# Patient Record
Sex: Male | Born: 1970
Health system: Southern US, Community
[De-identification: ages and names within clinical notes are randomized; demographics above are authoritative.]

## PROBLEM LIST (undated history)

## (undated) DIAGNOSIS — I639 Cerebral infarction, unspecified: Secondary | ICD-10-CM

## (undated) DIAGNOSIS — A609 Anogenital herpesviral infection, unspecified: Secondary | ICD-10-CM

## (undated) DIAGNOSIS — K219 Gastro-esophageal reflux disease without esophagitis: Secondary | ICD-10-CM

## (undated) DIAGNOSIS — Z9109 Other allergy status, other than to drugs and biological substances: Secondary | ICD-10-CM

## (undated) DIAGNOSIS — G4733 Obstructive sleep apnea (adult) (pediatric): Secondary | ICD-10-CM

## (undated) DIAGNOSIS — N200 Calculus of kidney: Secondary | ICD-10-CM

## (undated) DIAGNOSIS — I10 Essential (primary) hypertension: Secondary | ICD-10-CM

## (undated) HISTORY — DX: Obstructive sleep apnea (adult) (pediatric): G47.33

## (undated) HISTORY — PX: ROTATOR CUFF REPAIR: SHX139

## (undated) HISTORY — DX: Other allergy status, other than to drugs and biological substances: Z91.09

## (undated) HISTORY — DX: Essential (primary) hypertension: I10

## (undated) HISTORY — DX: Calculus of kidney: N20.0

## (undated) HISTORY — PX: LUMBAR DISC SURGERY: SHX700

## (undated) HISTORY — DX: Anogenital herpesviral infection, unspecified: A60.9

## (undated) HISTORY — PX: APPENDECTOMY: SHX54

---

## 1998-02-20 ENCOUNTER — Emergency Department (HOSPITAL_COMMUNITY): Admission: EM | Admit: 1998-02-20 | Discharge: 1998-02-20 | Payer: Self-pay | Admitting: Emergency Medicine

## 1999-07-14 ENCOUNTER — Emergency Department (HOSPITAL_COMMUNITY): Admission: EM | Admit: 1999-07-14 | Discharge: 1999-07-14 | Payer: Self-pay | Admitting: Emergency Medicine

## 1999-07-14 ENCOUNTER — Encounter: Payer: Self-pay | Admitting: Emergency Medicine

## 2002-09-26 ENCOUNTER — Emergency Department (HOSPITAL_COMMUNITY): Admission: EM | Admit: 2002-09-26 | Discharge: 2002-09-27 | Payer: Self-pay | Admitting: Emergency Medicine

## 2002-09-26 ENCOUNTER — Encounter: Payer: Self-pay | Admitting: Emergency Medicine

## 2010-11-22 ENCOUNTER — Encounter (HOSPITAL_COMMUNITY)
Admission: RE | Admit: 2010-11-22 | Discharge: 2010-11-22 | Disposition: A | Discharge status: Home / Self Care | Payer: Managed Care, Other (non HMO) | Source: Ambulatory Visit | Attending: Neurological Surgery | Admitting: Neurological Surgery

## 2010-11-22 LAB — CBC
HCT: 41 % (ref 39.0–52.0)
Hemoglobin: 14.5 g/dL (ref 13.0–17.0)
MCH: 31.3 pg (ref 26.0–34.0)
MCHC: 35.4 g/dL (ref 30.0–36.0)
MCV: 88.6 fL (ref 78.0–100.0)
Platelets: 184 10*3/uL (ref 150–400)
RBC: 4.63 MIL/uL (ref 4.22–5.81)
RDW: 12.6 % (ref 11.5–15.5)
WBC: 7.6 10*3/uL (ref 4.0–10.5)

## 2010-11-22 LAB — SURGICAL PCR SCREEN
MRSA, PCR: NEGATIVE
Staphylococcus aureus: POSITIVE — AB

## 2010-11-30 ENCOUNTER — Ambulatory Visit (HOSPITAL_COMMUNITY): Payer: Managed Care, Other (non HMO)

## 2010-11-30 ENCOUNTER — Ambulatory Visit (HOSPITAL_COMMUNITY)
Admission: RE | Admit: 2010-11-30 | Discharge: 2010-11-30 | Disposition: A | Discharge status: Home / Self Care | Payer: Managed Care, Other (non HMO) | Source: Ambulatory Visit | Attending: Neurological Surgery | Admitting: Neurological Surgery

## 2010-11-30 DIAGNOSIS — Z01812 Encounter for preprocedural laboratory examination: Secondary | ICD-10-CM | POA: Insufficient documentation

## 2010-11-30 DIAGNOSIS — M5020 Other cervical disc displacement, unspecified cervical region: Secondary | ICD-10-CM | POA: Insufficient documentation

## 2010-12-20 NOTE — Op Note (Signed)
Kyle Manning, Kyle Manning NO.:  [id number]  MEDICAL RECORD NO.:  [id number]  LOCATION:  3533                         FACILITY:  [facility]  PHYSICIAN:  Stefani Dama, M.D.  DATE OF BIRTH:  1970-04-20  DATE OF PROCEDURE:  11/30/2010 DATE OF DISCHARGE:  11/30/2010                              OPERATIVE REPORT   PREOPERATIVE DIAGNOSIS:  Herniated nucleus pulposus at C5-C6 of left with left cervical radiculopathy.  POSTOPERATIVE DIAGNOSIS:  Herniated nucleus pulposus at C5-C6 of left with left cervical radiculopathy.  PROCEDURE:  Microdiskectomy at C5-C6, left with operating microscope and microdissection technique in seated position, using Metrax operating system.  SURGEON:  Stefani Dama, MD  FIRST ASSISTANT:  Hilda Lias, MD  ANESTHESIA:  General endotracheal.  INDICATIONS:  Kyle Manning is a 40 year old individual who has had significant neck, shoulder, and left arm pain that came upon rather acutely about 2 months ago.  Kyle Manning has failed to get better and Kyle Manning feels that his strength is not as good as it had been on the left side, although Kyle Manning is a mesomorphically built individual and on confrontational testing his strength seems fine.  The patient notes that his left arm feels considerably weaker than it has been particularly when Kyle Manning is doing some body building exercises. Kyle Manning has been advised regarding the need for surgical decompression.  PROCEDURE:  The patient was brought to the operating room, supine on the stretcher.  After smooth induction of general endotracheal anesthesia, Kyle Manning had placement of central venous catheterization monitoring catheter then Kyle Manning was placed in a 3 pin headrest and gradually placed into the seated position.  Echo Doppler cardiography was obtained to check for air embolism, and the back of the neck was then prepped with alcohol and DuraPrep and draped in a sterile fashion.  Fluoroscopic guidance was used to localize the C5-C6  level on the left side of the needle.  A small vertical incision was then created over this area and a K-wire was passed down to the interlaminar space at C5-C6 on the left side.  Then by dissecting over this a series of dilators were placed to allow dilation to the 18 mm diameter and 18 mm x 6 cm deep endoscopic cannula was then fitted to the operating table with a clamp.  The operating microscope was brought into the view over this aperture and then through this opening, we did the surgery where we then removed the inferior margin lamina of C6-C5 and the mesial wall of the facet.  The superior margin of lamina and facet of C6 was also removed.  The thickened ligament over this area was then opened with a 2 mm Kerrison punch. Epidural bleeding was encountered and this was cauterized with bipolar cautery and some small pledgets of Gelfoam were also used to maintain hemostasis, then the shoulder and the nerve root was identified.  This was noted to be bulged dorsally and in the axilla of the nerve roots we were able to locate a substantial mass through some epidural veins. These veins were cauterized and divided.  Then further dissection yielded the first large fragment of disk which was removed and then several other smaller fragments of disk were identified in this  free space.  Ultimately, after removing numerous fragments I was able to palpate underneath the nerve root the common dural tube and found that this was greatly relaxed.  No other fragments could be retrieved using a ball dissector from both the shoulder of the nerve roots and from the axilla dissecting medially with this hemostasis was obtained.  Dr. Jeral Fruit and I inspected the area carefully.  No other fragments were felt to be in this region and at this point, we removed the operating microscope and removed the endoscopic cannula and then closed the fascia with 3-0 Vicryl interrupted fashion, 3-0 Vicryl was used to close  the subcuticular skin.  The patient tolerated the procedure well and was returned to recovery room in stable condition.  ESTIMATED BLOOD LOSS:  50 mL.     Stefani Dama, M.D.     Merla Riches  D:  11/30/2010  T:  11/30/2010  Job:  [id number]  Electronically Signed by Barnett Abu M.D. on 12/20/2010 07:08:43 AM

## 2011-03-28 ENCOUNTER — Other Ambulatory Visit (HOSPITAL_COMMUNITY): Payer: Managed Care, Other (non HMO)

## 2013-06-27 ENCOUNTER — Encounter: Payer: Self-pay | Admitting: *Deleted

## 2013-06-27 DIAGNOSIS — I10 Essential (primary) hypertension: Secondary | ICD-10-CM | POA: Insufficient documentation

## 2014-12-16 ENCOUNTER — Encounter: Payer: Self-pay | Admitting: Podiatry

## 2014-12-16 ENCOUNTER — Ambulatory Visit (INDEPENDENT_AMBULATORY_CARE_PROVIDER_SITE_OTHER): Payer: BLUE CROSS/BLUE SHIELD | Admitting: Podiatry

## 2014-12-16 ENCOUNTER — Ambulatory Visit (INDEPENDENT_AMBULATORY_CARE_PROVIDER_SITE_OTHER): Payer: BLUE CROSS/BLUE SHIELD

## 2014-12-16 VITALS — BP 117/73 | HR 66 | Resp 16

## 2014-12-16 DIAGNOSIS — M722 Plantar fascial fibromatosis: Secondary | ICD-10-CM | POA: Diagnosis not present

## 2014-12-16 MED ORDER — TRIAMCINOLONE ACETONIDE 10 MG/ML IJ SUSP
10.0000 mg | Freq: Once | INTRAMUSCULAR | Status: AC
  Administered 2014-12-16: 10 mg

## 2014-12-16 NOTE — Progress Notes (Signed)
   Subjective:    Patient ID: Kyle Manning, male    DOB: April 27, 1970, 44 y.o.   MRN: [id number]  HPI Comments: "I have like a lump in the bottom of this foot"  Patient presents with: Foot Pain: Arch right - soft knot for about 1 month, it has increased in size, flat feet, tried OTC gel insoles, he remembers stepping on a piece of metal several weeks ago.    Foot Pain Associated symptoms include arthralgias.      Review of Systems  Musculoskeletal: Positive for arthralgias.  All other systems reviewed and are negative.      Objective:   Physical Exam        Assessment & Plan:

## 2014-12-17 NOTE — Progress Notes (Signed)
Subjective:     Patient ID: Kyle Manning, male   DOB: 03/02/1970, 44 y.o.   MRN: [id number]  HPI patient states I'm having pain in my mid arch area with a small nodule and I might have traumatized this. Patient states it's been bothersome for a month or 2 and he thinks it's increased slightly in size   Review of Systems  All other systems reviewed and are negative.      Objective:   Physical Exam  Constitutional: He is oriented to person, place, and time.  Cardiovascular: Intact distal pulses.   Musculoskeletal: Normal range of motion.  Neurological: He is oriented to person, place, and time.  Skin: Skin is warm.  Nursing note and vitals reviewed.  neurovascular status intact muscle strength adequate range of motion within normal limits. Patient's found have good digital perfusion is well oriented 3 and has no equinus condition. Plantar aspect of the right arch on the lateral side there is a small nodule measuring approximately 4 x 4 mm with no indications of skin penetration or indications of previous skin trauma. He does not remember the area bleeding when he traumatized     Assessment:     Possible inclusion cyst versus possible fasciitis or other inflammatory condition localized in nature    Plan:     H&P and x-ray reviewed. Today I did a careful injection to reduce the swelling 3 mg Kenalog 5 mg Xylocaine and went ahead and advised on heat and ice therapy. I instructed that if it does not shrink and go away or if it should remain painful or increase in size a one see him immediately and may eventually require excision. Reappoint to recheck as needed

## 2015-09-29 DIAGNOSIS — J309 Allergic rhinitis, unspecified: Secondary | ICD-10-CM | POA: Diagnosis not present

## 2015-09-29 DIAGNOSIS — G4733 Obstructive sleep apnea (adult) (pediatric): Secondary | ICD-10-CM | POA: Diagnosis not present

## 2015-09-29 DIAGNOSIS — I10 Essential (primary) hypertension: Secondary | ICD-10-CM | POA: Diagnosis not present

## 2016-02-27 DIAGNOSIS — A084 Viral intestinal infection, unspecified: Secondary | ICD-10-CM | POA: Diagnosis not present

## 2016-04-05 DIAGNOSIS — J309 Allergic rhinitis, unspecified: Secondary | ICD-10-CM | POA: Diagnosis not present

## 2016-04-05 DIAGNOSIS — G4733 Obstructive sleep apnea (adult) (pediatric): Secondary | ICD-10-CM | POA: Diagnosis not present

## 2016-04-05 DIAGNOSIS — I10 Essential (primary) hypertension: Secondary | ICD-10-CM | POA: Diagnosis not present

## 2016-04-05 DIAGNOSIS — Z125 Encounter for screening for malignant neoplasm of prostate: Secondary | ICD-10-CM | POA: Diagnosis not present

## 2016-04-05 DIAGNOSIS — E7800 Pure hypercholesterolemia, unspecified: Secondary | ICD-10-CM | POA: Diagnosis not present

## 2016-08-23 DIAGNOSIS — K219 Gastro-esophageal reflux disease without esophagitis: Secondary | ICD-10-CM | POA: Diagnosis not present

## 2016-08-23 DIAGNOSIS — I10 Essential (primary) hypertension: Secondary | ICD-10-CM | POA: Diagnosis not present

## 2016-08-23 DIAGNOSIS — F430 Acute stress reaction: Secondary | ICD-10-CM | POA: Diagnosis not present

## 2016-10-25 DIAGNOSIS — I10 Essential (primary) hypertension: Secondary | ICD-10-CM | POA: Diagnosis not present

## 2016-10-25 DIAGNOSIS — M542 Cervicalgia: Secondary | ICD-10-CM | POA: Diagnosis not present

## 2016-11-01 DIAGNOSIS — Z0000 Encounter for general adult medical examination without abnormal findings: Secondary | ICD-10-CM | POA: Diagnosis not present

## 2017-03-26 DIAGNOSIS — I10 Essential (primary) hypertension: Secondary | ICD-10-CM | POA: Diagnosis not present

## 2017-03-26 DIAGNOSIS — Z125 Encounter for screening for malignant neoplasm of prostate: Secondary | ICD-10-CM | POA: Diagnosis not present

## 2017-03-26 DIAGNOSIS — Z0000 Encounter for general adult medical examination without abnormal findings: Secondary | ICD-10-CM | POA: Diagnosis not present

## 2017-04-04 DIAGNOSIS — M1712 Unilateral primary osteoarthritis, left knee: Secondary | ICD-10-CM | POA: Diagnosis not present

## 2017-04-30 DIAGNOSIS — G4730 Sleep apnea, unspecified: Secondary | ICD-10-CM | POA: Diagnosis not present

## 2017-04-30 DIAGNOSIS — J029 Acute pharyngitis, unspecified: Secondary | ICD-10-CM | POA: Diagnosis not present

## 2017-04-30 DIAGNOSIS — J069 Acute upper respiratory infection, unspecified: Secondary | ICD-10-CM | POA: Diagnosis not present

## 2017-08-06 DIAGNOSIS — M67911 Unspecified disorder of synovium and tendon, right shoulder: Secondary | ICD-10-CM | POA: Diagnosis not present

## 2017-08-27 DIAGNOSIS — M75121 Complete rotator cuff tear or rupture of right shoulder, not specified as traumatic: Secondary | ICD-10-CM | POA: Diagnosis not present

## 2017-09-03 ENCOUNTER — Other Ambulatory Visit: Payer: Self-pay | Admitting: Orthopedic Surgery

## 2017-09-03 DIAGNOSIS — M75121 Complete rotator cuff tear or rupture of right shoulder, not specified as traumatic: Secondary | ICD-10-CM

## 2017-09-12 ENCOUNTER — Ambulatory Visit
Admission: RE | Admit: 2017-09-12 | Discharge: 2017-09-12 | Disposition: A | Discharge status: Home / Self Care | Payer: BLUE CROSS/BLUE SHIELD | Source: Ambulatory Visit | Attending: Orthopedic Surgery | Admitting: Orthopedic Surgery

## 2017-09-12 ENCOUNTER — Ambulatory Visit
Admission: RE | Admit: 2017-09-12 | Discharge: 2017-09-12 | Disposition: A | Discharge status: Home / Self Care | Payer: Managed Care, Other (non HMO) | Source: Ambulatory Visit | Attending: Orthopedic Surgery | Admitting: Orthopedic Surgery

## 2017-09-12 DIAGNOSIS — M75121 Complete rotator cuff tear or rupture of right shoulder, not specified as traumatic: Secondary | ICD-10-CM

## 2017-09-12 DIAGNOSIS — S46011A Strain of muscle(s) and tendon(s) of the rotator cuff of right shoulder, initial encounter: Secondary | ICD-10-CM | POA: Diagnosis not present

## 2017-09-12 DIAGNOSIS — M25511 Pain in right shoulder: Secondary | ICD-10-CM | POA: Diagnosis not present

## 2017-09-12 MED ORDER — IOPAMIDOL (ISOVUE-M 200) INJECTION 41%
15.0000 mL | Freq: Once | INTRAMUSCULAR | Status: AC
  Administered 2017-09-12: 15 mL via INTRA_ARTICULAR

## 2017-09-23 DIAGNOSIS — J309 Allergic rhinitis, unspecified: Secondary | ICD-10-CM | POA: Diagnosis not present

## 2017-09-23 DIAGNOSIS — G4733 Obstructive sleep apnea (adult) (pediatric): Secondary | ICD-10-CM | POA: Diagnosis not present

## 2017-09-23 DIAGNOSIS — I10 Essential (primary) hypertension: Secondary | ICD-10-CM | POA: Diagnosis not present

## 2017-09-24 DIAGNOSIS — M75121 Complete rotator cuff tear or rupture of right shoulder, not specified as traumatic: Secondary | ICD-10-CM | POA: Diagnosis not present

## 2017-10-02 DIAGNOSIS — M25571 Pain in right ankle and joints of right foot: Secondary | ICD-10-CM | POA: Diagnosis not present

## 2017-10-02 DIAGNOSIS — M75121 Complete rotator cuff tear or rupture of right shoulder, not specified as traumatic: Secondary | ICD-10-CM | POA: Diagnosis not present

## 2017-10-17 DIAGNOSIS — Z23 Encounter for immunization: Secondary | ICD-10-CM | POA: Diagnosis not present

## 2017-11-06 DIAGNOSIS — M67911 Unspecified disorder of synovium and tendon, right shoulder: Secondary | ICD-10-CM | POA: Diagnosis not present

## 2018-01-19 DIAGNOSIS — J310 Chronic rhinitis: Secondary | ICD-10-CM | POA: Diagnosis not present

## 2018-03-19 DIAGNOSIS — I10 Essential (primary) hypertension: Secondary | ICD-10-CM | POA: Diagnosis not present

## 2018-03-19 DIAGNOSIS — Z0000 Encounter for general adult medical examination without abnormal findings: Secondary | ICD-10-CM | POA: Diagnosis not present

## 2018-03-19 DIAGNOSIS — Z23 Encounter for immunization: Secondary | ICD-10-CM | POA: Diagnosis not present

## 2018-03-19 DIAGNOSIS — Z125 Encounter for screening for malignant neoplasm of prostate: Secondary | ICD-10-CM | POA: Diagnosis not present

## 2018-09-18 DIAGNOSIS — G4733 Obstructive sleep apnea (adult) (pediatric): Secondary | ICD-10-CM | POA: Diagnosis not present

## 2018-09-18 DIAGNOSIS — I10 Essential (primary) hypertension: Secondary | ICD-10-CM | POA: Diagnosis not present

## 2018-09-18 DIAGNOSIS — J309 Allergic rhinitis, unspecified: Secondary | ICD-10-CM | POA: Diagnosis not present

## 2018-10-29 DIAGNOSIS — Z20828 Contact with and (suspected) exposure to other viral communicable diseases: Secondary | ICD-10-CM | POA: Diagnosis not present

## 2019-02-27 DIAGNOSIS — Z20828 Contact with and (suspected) exposure to other viral communicable diseases: Secondary | ICD-10-CM | POA: Diagnosis not present

## 2019-03-26 DIAGNOSIS — Z13220 Encounter for screening for lipoid disorders: Secondary | ICD-10-CM | POA: Diagnosis not present

## 2019-03-26 DIAGNOSIS — Z125 Encounter for screening for malignant neoplasm of prostate: Secondary | ICD-10-CM | POA: Diagnosis not present

## 2019-03-26 DIAGNOSIS — I10 Essential (primary) hypertension: Secondary | ICD-10-CM | POA: Diagnosis not present

## 2019-03-26 DIAGNOSIS — Z0000 Encounter for general adult medical examination without abnormal findings: Secondary | ICD-10-CM | POA: Diagnosis not present

## 2019-10-12 DIAGNOSIS — E7800 Pure hypercholesterolemia, unspecified: Secondary | ICD-10-CM | POA: Diagnosis not present

## 2019-10-12 DIAGNOSIS — G4733 Obstructive sleep apnea (adult) (pediatric): Secondary | ICD-10-CM | POA: Diagnosis not present

## 2019-10-12 DIAGNOSIS — J309 Allergic rhinitis, unspecified: Secondary | ICD-10-CM | POA: Diagnosis not present

## 2019-10-12 DIAGNOSIS — I10 Essential (primary) hypertension: Secondary | ICD-10-CM | POA: Diagnosis not present

## 2019-11-02 DIAGNOSIS — M7671 Peroneal tendinitis, right leg: Secondary | ICD-10-CM | POA: Diagnosis not present

## 2019-12-31 DIAGNOSIS — I10 Essential (primary) hypertension: Secondary | ICD-10-CM | POA: Diagnosis not present

## 2019-12-31 DIAGNOSIS — M7671 Peroneal tendinitis, right leg: Secondary | ICD-10-CM | POA: Diagnosis not present

## 2020-03-31 DIAGNOSIS — Z0000 Encounter for general adult medical examination without abnormal findings: Secondary | ICD-10-CM | POA: Diagnosis not present

## 2020-03-31 DIAGNOSIS — Z125 Encounter for screening for malignant neoplasm of prostate: Secondary | ICD-10-CM | POA: Diagnosis not present

## 2020-03-31 DIAGNOSIS — I10 Essential (primary) hypertension: Secondary | ICD-10-CM | POA: Diagnosis not present

## 2020-03-31 DIAGNOSIS — E7800 Pure hypercholesterolemia, unspecified: Secondary | ICD-10-CM | POA: Diagnosis not present

## 2020-04-04 IMAGING — XA DG FLUORO GUIDE NDL PLC/BX
2 series · 4 of 4 positions shown · non-contrast
Comparison: none

CLINICAL DATA: RIGHT shoulder pain.  Multiple previous surgeries.

[Series 2: ortho adipose · 1 of 1 slices shown (1 of 2)]
[im 1/1]
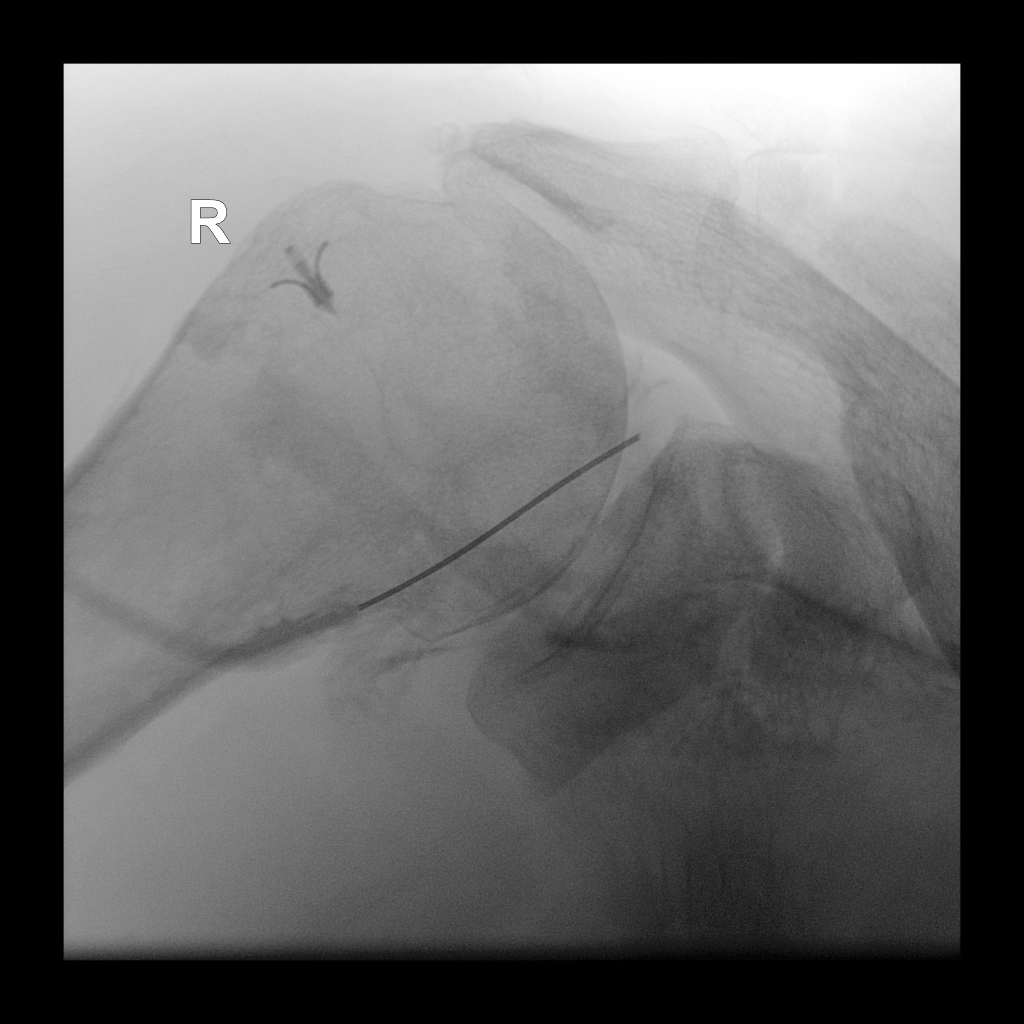

[Series 3: ortho adipose · 3 of 4 frames shown (2 of 2)]
[frame 1/4]
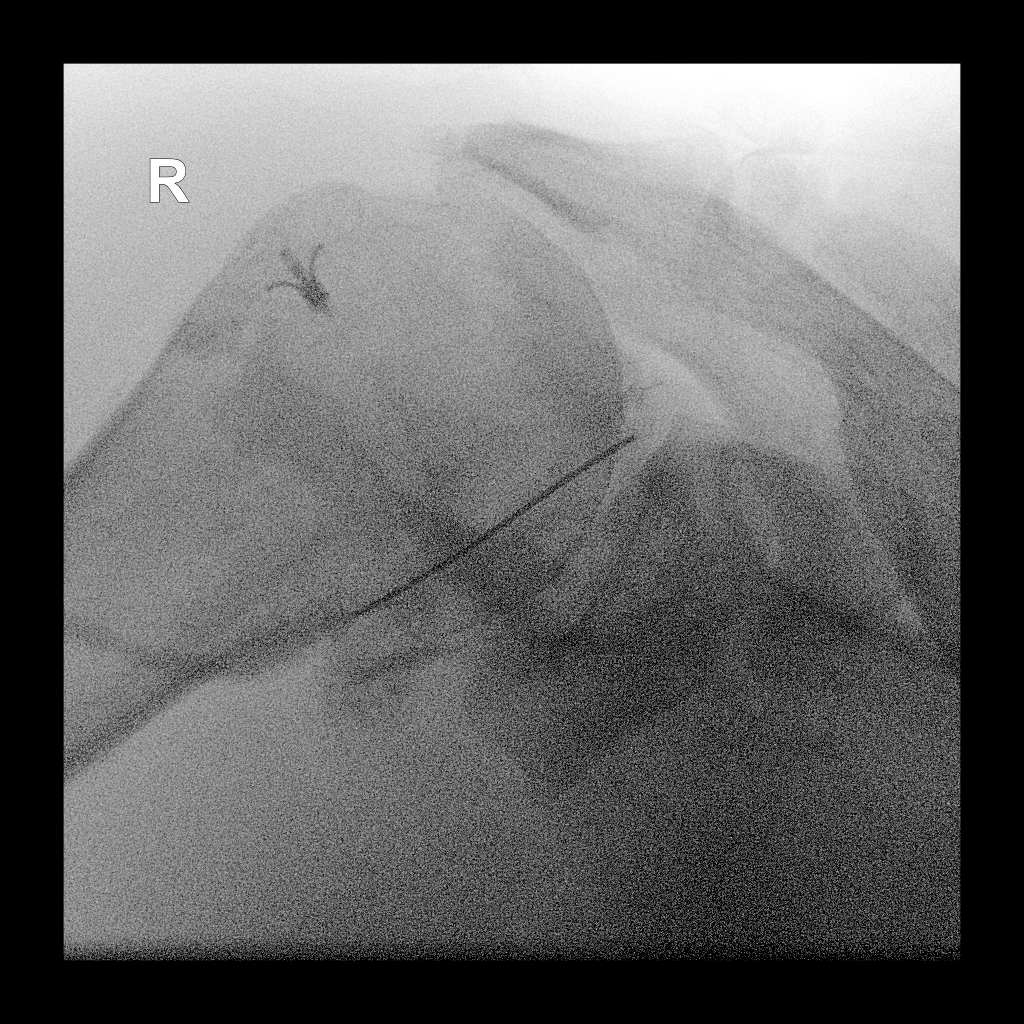
[frame 3/4]
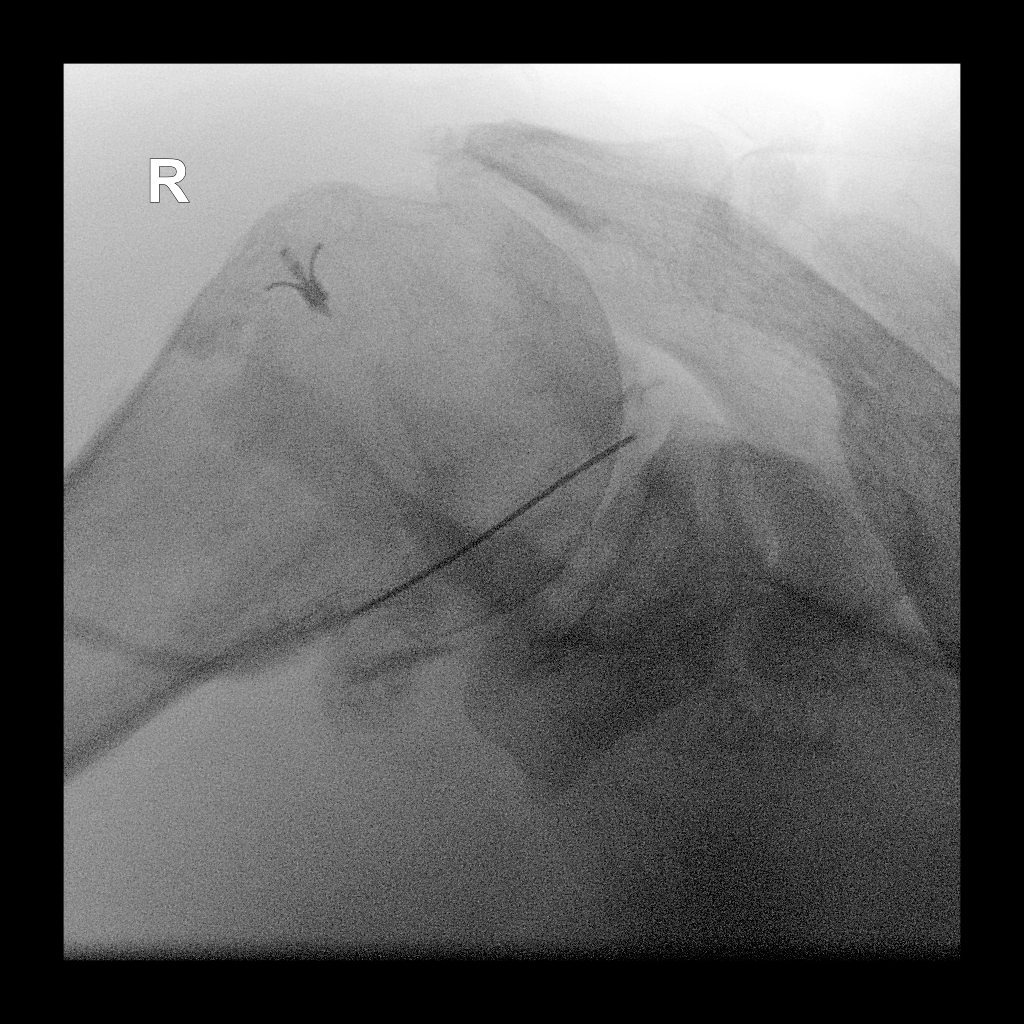
[frame 4/4]
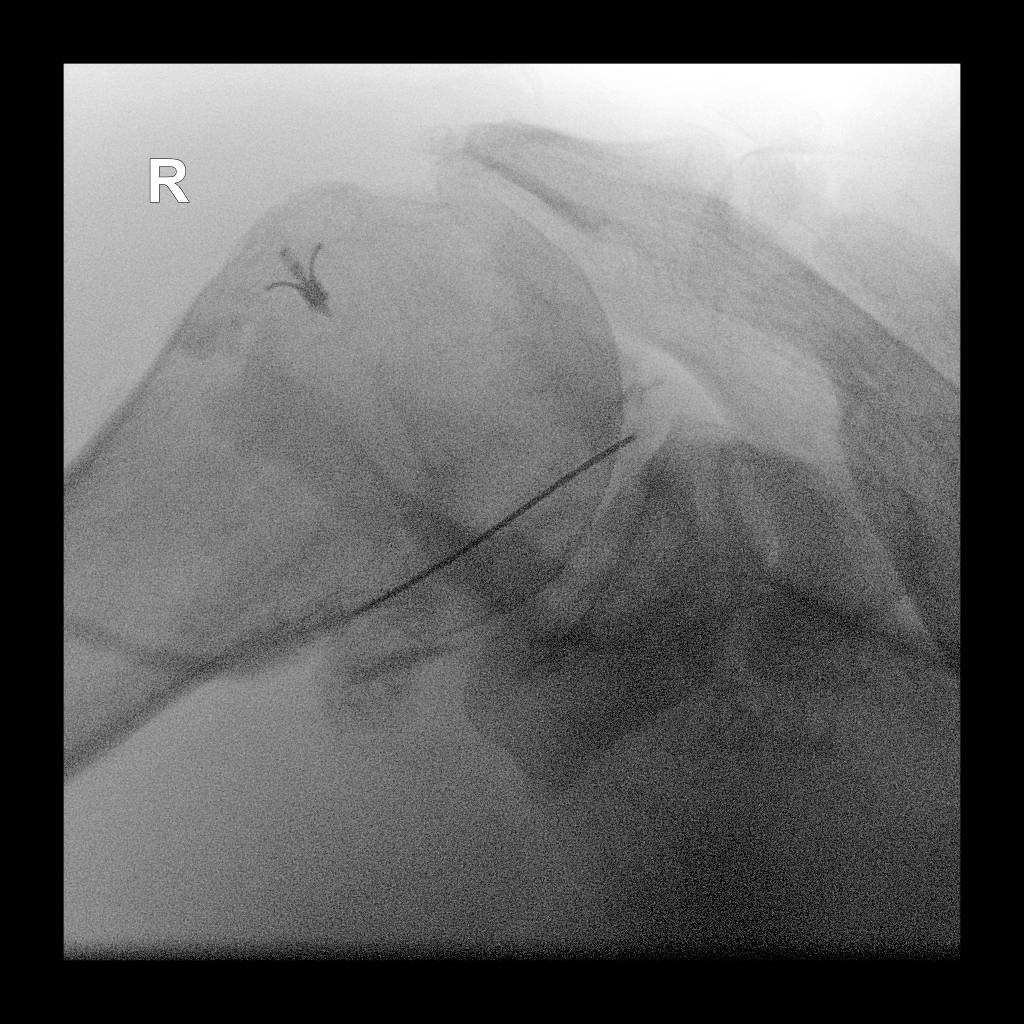

[4 of 4 positions shown; findings below may reference images not displayed]

FLUOROSCOPY TIME:  34 seconds corresponding to a Dose Area Product
of 56.6 Gy*m2

PROCEDURE:
RIGHT SHOULDER INJECTION UNDER FLUOROSCOPY

Informed written consent was obtained. Time-out was performed. An
appropriate skin entrance site was determined. The site was marked,
prepped with Betadine, draped in the usual sterile fashion, and
infiltrated locally with 1% lidocaine. 22 gauge spinal needle was
advanced to the superomedial margin of the humeral head under
intermittent fluoroscopy. 1 ml of Lidocaine injected easily. A
mixture of 0.1 ml Multihance and 20 ml of dilute Isovue-M 200 was
then used to opacify the RIGHT shoulder capsule. No immediate
complication.
IMPRESSION: Technically successful RIGHT shoulder injection for MRI.

## 2020-04-04 IMAGING — MR MR SHOULDER*R* W/CM
6 series · 40 of 40 positions shown · IV contrast (agent unspecified)
Comparison: MR arthrogram right shoulder dated February 09, 2009.

CLINICAL DATA: Sudden onset right shoulder pain after heavy lifting
1 month ago. Prior rotator cuff surgery in 5006 and 1533.

EXAM:
MR ARTHROGRAM OF THE RIGHT SHOULDER
TECHNIQUE: Multiplanar, multisequence MR imaging of the right shoulder was
performed following the administration of intra-articular contrast.
CONTRAST:  See Injection Documentation.

[Series 7: T1 fat-sat · axial · 4.0mm · 0.27mm/px · z∈[+7,+82]mm · 8 of 18 slices shown (1 of 4)]
[im 1/18]
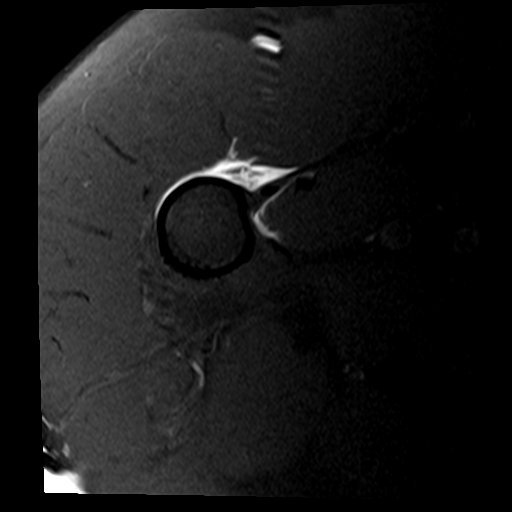
[im 3/18]
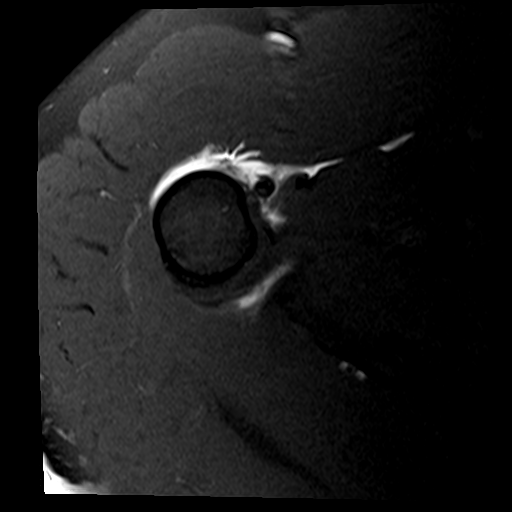
[im 5/18]
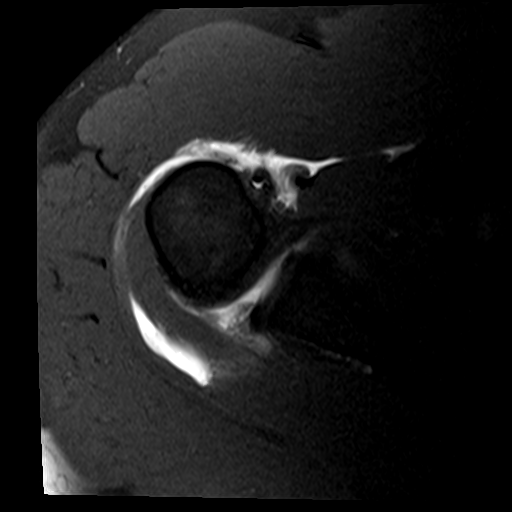
[im 8/18]
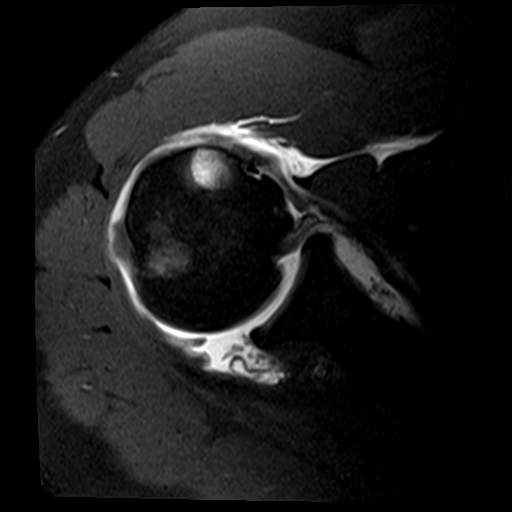
[im 10/18]
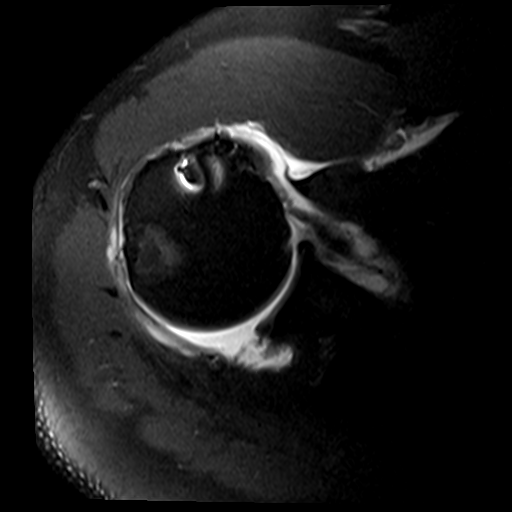
[im 13/18]
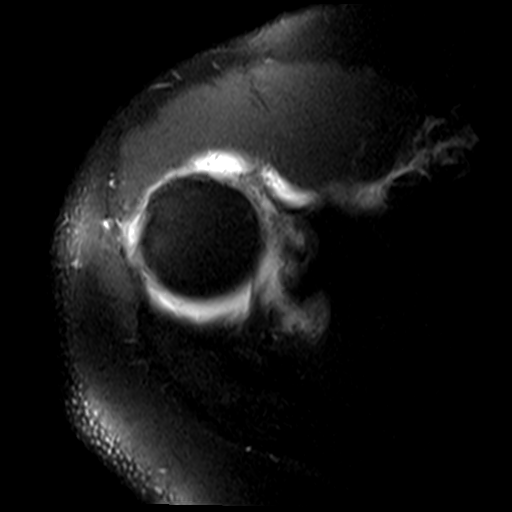
[im 15/18]
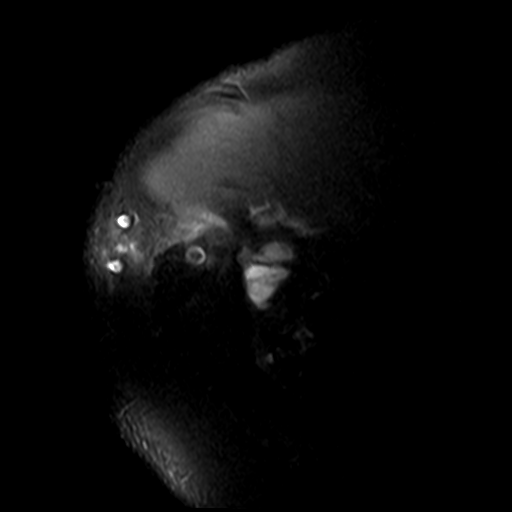
[im 18/18]
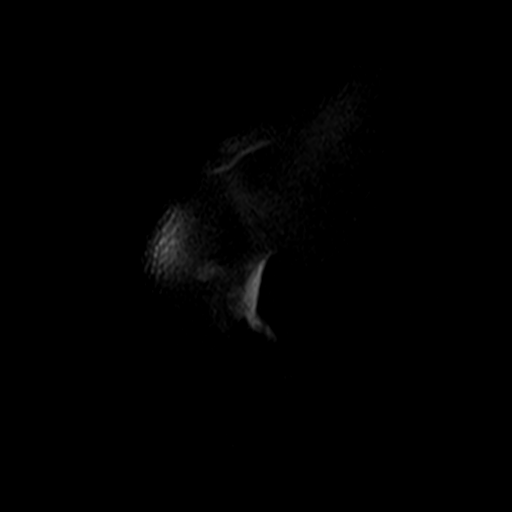

[Series 8: T1 fat-sat · oblique · 4.0mm · 0.59mm/px · 6 of 16 slices shown (2 of 4)]
[im 1/16]
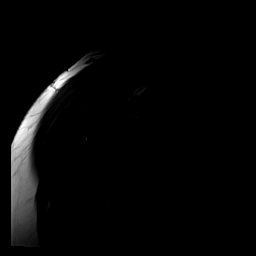
[im 4/16]
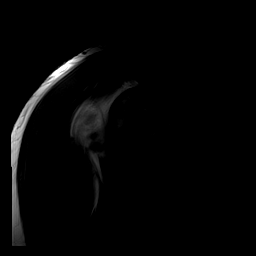
[im 7/16]
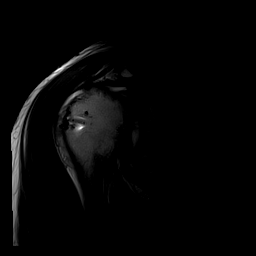
[im 10/16]
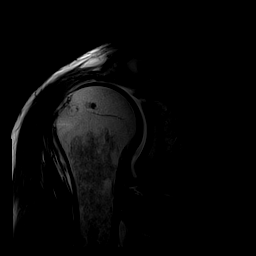
[im 13/16]
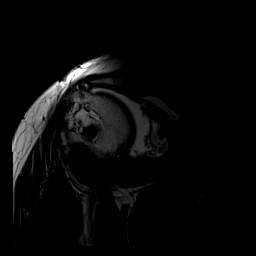
[im 16/16]
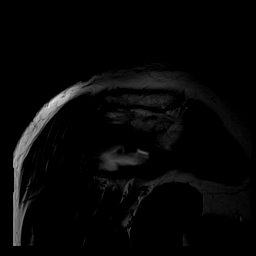

[Series 9: T1 fat-sat · oblique · 4.0mm · 0.59mm/px · 6 of 16 slices shown (3 of 4)]
[im 1/16]
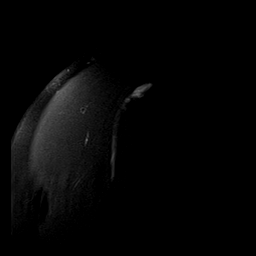
[im 4/16]
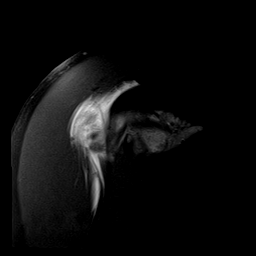
[im 7/16]
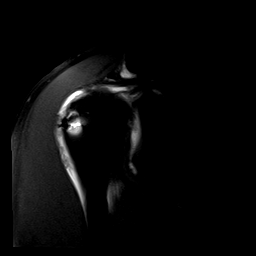
[im 10/16]
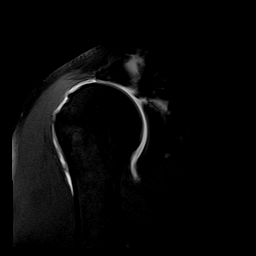
[im 13/16]
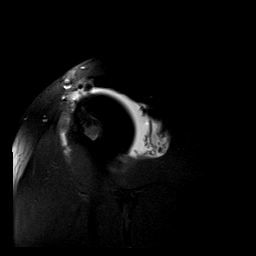
[im 16/16]
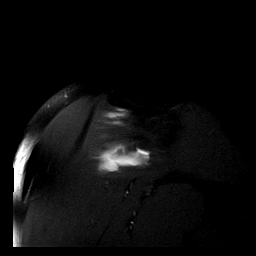

[Series 10: T2 fat-sat · oblique · 4.0mm · 0.59mm/px · 6 of 16 slices shown (1 of 2)]
[im 1/16]
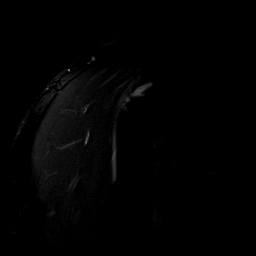
[im 4/16]
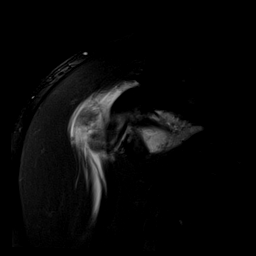
[im 7/16]
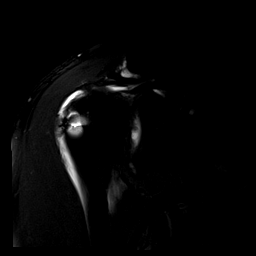
[im 10/16]
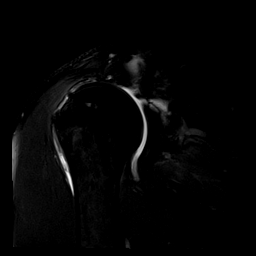
[im 13/16]
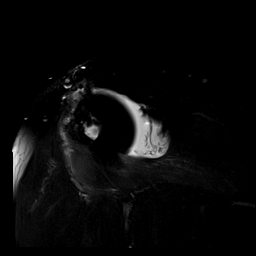
[im 16/16]
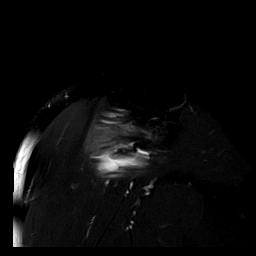

[Series 11: T2 fat-sat · oblique · 4.0mm · 0.61mm/px · 7 of 18 slices shown (2 of 2)]
[im 1/18]
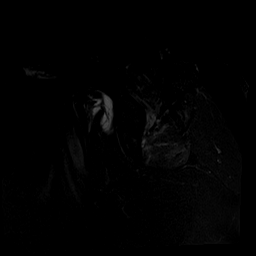
[im 3/18]
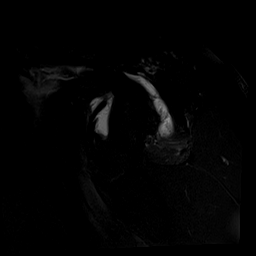
[im 6/18]
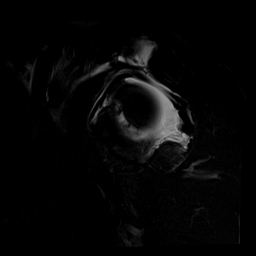
[im 9/18]
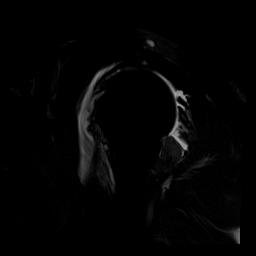
[im 12/18]
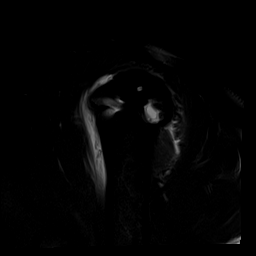
[im 15/18]
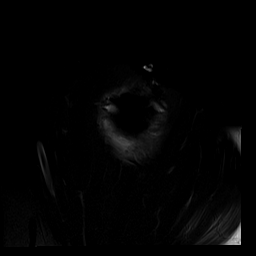
[im 18/18]
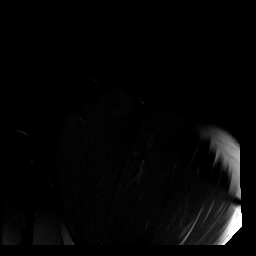

[Series 15: T1 fat-sat · sagittal · 4.0mm · 0.59mm/px · 7 of 17 slices shown (4 of 4)]
[im 1/17]
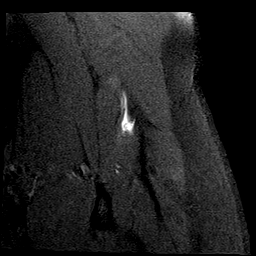
[im 3/17]
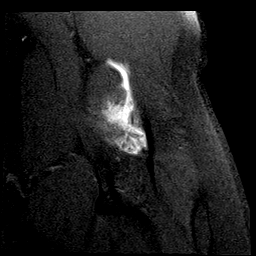
[im 6/17]
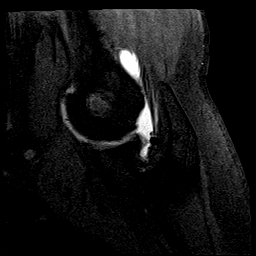
[im 9/17]
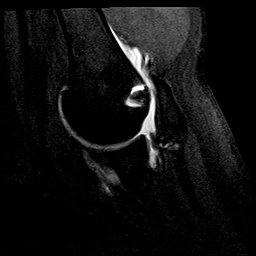
[im 11/17]
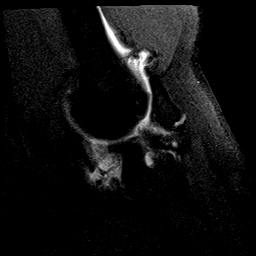
[im 14/17]
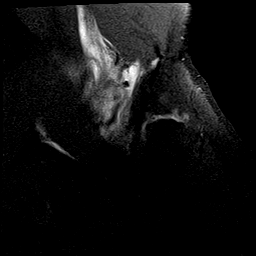
[im 17/17]
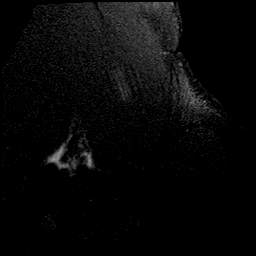

[40 of 40 positions shown; findings below may reference images not displayed]

FINDINGS: Rotator cuff: Full-thickness, full width tear of the supraspinatus
tendon with retraction to the glenoid. Full-thickness, partial width
tear of the infraspinatus tendon. High-grade partial-thickness
articular surface and interstitial tear of the distal subscapularis
tendon. Full-thickness tear of the teres minor tendon.

Muscles: Mild edema within the infraspinatus and teres minor
muscles. Moderate supraspinatus and infraspinatus muscle atrophy.
Mild subscapularis muscle atrophy.

Biceps long head:  Prior biceps tenotomy, intact.

Acromioclavicular Joint: The acromion is type II. Possible prior
acromioplasty. Contrast within the subacromial/subdeltoid bursa
extending into the acromioclavicular joint.

Glenohumeral Joint: Distended with intra-articular contrast. No
focal cartilage defect.

Labrum:  No evidence of labral tear.

Bones: Large degenerative subchondral cyst in the posterior humeral
head again noted. No suspicious bone lesion. No acute fracture or
dislocation.

Other: No significant soft tissue findings.
IMPRESSION: 1. Massive rotator cuff tear with full-thickness tears of the
supraspinatus, infraspinatus, and teres minor. High-grade
partial-thickness tear of the distal subscapularis tendon.
2. Moderate supraspinatus and infraspinatus muscle atrophy. Mild
subscapularis muscle atrophy.

## 2020-04-28 ENCOUNTER — Ambulatory Visit: Payer: BC Managed Care – PPO | Admitting: Cardiology

## 2020-04-28 ENCOUNTER — Other Ambulatory Visit: Payer: Self-pay

## 2020-04-28 ENCOUNTER — Ambulatory Visit: Payer: BLUE CROSS/BLUE SHIELD | Admitting: Cardiology

## 2020-04-28 ENCOUNTER — Encounter: Payer: Self-pay | Admitting: Cardiology

## 2020-04-28 VITALS — BP 132/86 | HR 76 | Ht 70.0 in | Wt 317.2 lb

## 2020-04-28 DIAGNOSIS — I10 Essential (primary) hypertension: Secondary | ICD-10-CM

## 2020-04-28 DIAGNOSIS — G4733 Obstructive sleep apnea (adult) (pediatric): Secondary | ICD-10-CM | POA: Diagnosis not present

## 2020-04-28 NOTE — Patient Instructions (Signed)
Medication Instructions:  Your physician recommends that you continue on your current medications as directed. Please refer to the Current Medication list given to you today.  *If you need a refill on your cardiac medications before your next appointment, please call your pharmacy*   Testing/Procedures: Your physician has recommended that you have a sleep study. This test records several body functions during sleep, including: brain activity, eye movement, oxygen and carbon dioxide blood levels, heart rate and rhythm, breathing rate and rhythm, the flow of air through your mouth and nose, snoring, body muscle movements, and chest and belly movement.  Follow-Up: At [facility], you and your health needs are our priority.  As part of our continuing mission to provide you with exceptional heart care, we have created designated Provider Care Teams.  These Care Teams include your primary Cardiologist (physician) and Advanced Practice Providers (APPs -  Physician Assistants and Nurse Practitioners) who all work together to provide you with the care you need, when you need it.  Follow up with Dr. Mayford Knife as needed based off results from sleep study.

## 2020-04-28 NOTE — Progress Notes (Signed)
Sleep Medicine Consult Note    Date:  04/28/2020   ID:  Kyle Manning, DOB Mar 09, 1970, MRN [id number]  PCP:  Kyle Heys, MD  Cardiologist:  Kyle Magic, MD   Chief Complaint  Patient presents with  . New Patient (Initial Visit)    OSA    History of Present Illness:  Kyle Manning is a 50 y.o. male who is being seen today for the evaluation of OSA at the request of Kyle Heys, MD.  This is a 50yo male with a hx of HTN and OSA.  He was dx with OSA at least 10 years ago and has been on CPAP.  His machine is old and he is now having problems with his device. He has a Respironics device which has been recalled.  He says that his device was not working and would shut off in the middle of the night.  He has not been using his device for almost a year now.    He tells me that he feels very tired when he gets up in the am and during the day but does not nap.  His wife says that he snores at night.  He also has 8 birds as pets at his house that are very loud so during the day he does not nap.  He has been waking himself up snoring and gasping for breath at night and also notices that he is wheezing at night as well.   Past Medical History:  Diagnosis Date  . HSV (herpes simplex virus) anogenital infection   . Hypertension   . Kidney stone   . OSA (obstructive sleep apnea)   . Pollen allergy     Past Surgical History:  Procedure Laterality Date  . APPENDECTOMY    . LUMBAR DISC SURGERY    . ROTATOR CUFF REPAIR      Current Medications: Current Meds  Medication Sig  . bisoprolol-hydrochlorothiazide (ZIAC) 5-6.25 MG per tablet Take 1 tablet by mouth daily.  . cetirizine (ZYRTEC) 10 MG tablet Take 10 mg by mouth daily.  . fluticasone (FLONASE) 50 MCG/ACT nasal spray Place into both nostrils daily.  Marland Kitchen ibuprofen (ADVIL,MOTRIN) 200 MG tablet Take 400 mg by mouth daily.  Marland Kitchen lisinopril (ZESTRIL) 10 MG tablet Take 10 mg by mouth daily.  . Multiple Vitamin  (MULTIVITAMIN) capsule Take 1 capsule by mouth daily.    Allergies:   Patient has no known allergies.   Social History   Socioeconomic History  . Marital status: Married    Spouse name: Not on file  . Number of children: Not on file  . Years of education: Not on file  . Highest education level: Not on file  Occupational History  . Not on file  Tobacco Use  . Smoking status: Former Smoker    Quit date: 06/28/2010    Years since quitting: 9.8  . Smokeless tobacco: Former Engineer, water and Sexual Activity  . Alcohol use: No  . Drug use: Not on file  . Sexual activity: Not on file  Other Topics Concern  . Not on file  Social History Narrative  . Not on file   Social Determinants of Health   Financial Resource Strain: Not on file  Food Insecurity: Not on file  Transportation Needs: Not on file  Physical Activity: Not on file  Stress: Not on file  Social Connections: Not on file     Family History:  The patient's family history includes Diabetes in his  father; Glaucoma in his mother; Hyperlipidemia in his mother; Prostate cancer in his father.   ROS:   Please see the history of present illness.    ROS All other systems reviewed and are negative.  No flowsheet data found.     PHYSICAL EXAM:   VS:  BP 132/86   Pulse 76   Ht 5\' 10"  (1.778 m)   Wt (!) 317 lb 3.2 oz (143.9 kg)   SpO2 98%   BMI 45.51 kg/m    GEN: Well nourished, well developed, in no acute distress  HEENT: normal  Neck: no JVD, carotid bruits, or masses Cardiac: RRR; no murmurs, rubs, or gallops,no edema.  Intact distal pulses bilaterally.  Respiratory:  clear to auscultation bilaterally, normal work of breathing GI: soft, nontender, nondistended, + BS MS: no deformity or atrophy  Skin: warm and dry, no rash Neuro:  Alert and Oriented x 3, Strength and sensation are intact Psych: euthymic mood, full affect  Wt Readings from Last 3 Encounters:  04/28/20 (!) 317 lb 3.2 oz (143.9 kg)       Studies/Labs Reviewed:   EKG:  EKG is not ordered today.    Recent Labs: No results found for requested labs within last 8760 hours.   Lipid Panel No results found for: CHOL, TRIG, HDL, CHOLHDL, VLDL, LDLCALC, LDLDIRECT   Additional studies/ records that were reviewed today include:  OV notes from PCP    ASSESSMENT:    1. OSA (obstructive sleep apnea)   2. Primary hypertension      PLAN:  In order of problems listed above:  1. OSA -he has been on CPAP for years and his device is > 41 years old and also has been recalled -he has not had a sleep eval in over 15 years -recommend split night sleep study to reevaluate OSA with repeat titration and order a new device  2.  HTN -BP controlled on exam today -continue Bisoprolol-HCT 5-6.25mg  daily    Medication Adjustments/Labs and Tests Ordered: Current medicines are reviewed at length with the patient today.  Concerns regarding medicines are outlined above.  Medication changes, Labs and Tests ordered today are listed in the Patient Instructions below.  There are no Patient Instructions on file for this visit.   Signed, 5, MD  04/28/2020 11:48 AM    [facility] [address] [place], [place], [place]  [place] Phone: [phone]; Fax: [phone]

## 2020-04-28 NOTE — Addendum Note (Signed)
Addended by: Theresia Majors on: 04/28/2020 11:53 AM   Modules accepted: Orders

## 2020-04-29 ENCOUNTER — Telehealth: Payer: Self-pay | Admitting: *Deleted

## 2020-04-29 DIAGNOSIS — G4733 Obstructive sleep apnea (adult) (pediatric): Secondary | ICD-10-CM

## 2020-04-29 NOTE — Telephone Encounter (Signed)
-----   Message from Theresia Majors, RN sent at 04/28/2020 12:17 PM EST ----- Split night sleep study has been ordered.  Thanks! Carly

## 2020-04-29 NOTE — Telephone Encounter (Signed)
Staff message sent to Coralee [place] ok to schedule sleep study per [employer] no PA is required. Call reference # [id number].

## 2020-05-05 NOTE — Telephone Encounter (Signed)
Patient is scheduled for lab study on 07/04/20. Patient understands his sleep study will be done at [facility] sleep lab. Patient understands he will receive a sleep packet in a week or so. Patient understands to call if he does not receive the sleep packet in a timely manner. Patient agrees with treatment and thanked me for call.

## 2020-06-02 DIAGNOSIS — Z23 Encounter for immunization: Secondary | ICD-10-CM | POA: Diagnosis not present

## 2020-07-04 ENCOUNTER — Ambulatory Visit (HOSPITAL_BASED_OUTPATIENT_CLINIC_OR_DEPARTMENT_OTHER): Payer: BC Managed Care – PPO | Attending: Cardiology | Admitting: Cardiology

## 2020-07-04 ENCOUNTER — Other Ambulatory Visit: Payer: Self-pay

## 2020-07-04 DIAGNOSIS — G4733 Obstructive sleep apnea (adult) (pediatric): Secondary | ICD-10-CM | POA: Diagnosis not present

## 2020-07-12 ENCOUNTER — Telehealth: Payer: Self-pay | Admitting: Cardiology

## 2020-07-12 NOTE — Telephone Encounter (Signed)
Patient called back to say that he think he had an panic attack earlier so he went to the first station they took his bp which was 190/110 and did ekg. Please advise.

## 2020-07-12 NOTE — Telephone Encounter (Signed)
PT is requesting a earlier appt sooner than 7/22

## 2020-07-12 NOTE — Telephone Encounter (Signed)
Patient does not need a sooner appointment since dr Mayford Knife has not read his study yet.

## 2020-07-14 NOTE — Telephone Encounter (Signed)
Spoke with the patient who states that he called in because he had a panic attack the other day and went to the fire station to be checked. He states that he was having chest tightness and felt claustrophobic with difficulty breathing. His blood pressure was 190/110 at that time. The patient had an EKG done at the time. He states that he was advised to follow up with his doctor. Patient has seen Dr. Mayford Knife in the past for sleep apnea and his sleep study results are pending. He would like to establish care with Dr. Mayford Knife for cardiology as well.  He has not had any more chest pain. He does report feeling fatigued throughout the day but has not been using his CPAP machine because his was recalled. He is awaiting sleep study results so that he can get a new device.  He has not had any more episodes of chest pain/tightness. He has not been monitoring his blood pressure at home, which I have advised him to start doing regularly. Patient has also been advised of ER precautions for any recurrent episodes of chest pain. Patient verbalized understanding.

## 2020-07-20 NOTE — Procedures (Signed)
Patient Name: Kyle Manning, Kyle Manning Date: 07/04/2020 Gender: Male D.O.B: [birth year] Age (years): 52 Referring Provider: Fransico Him MD, ABSM Height (inches): 70 Interpreting Physician: Fransico Him MD, ABSM Weight (lbs): 299 RPSGT: Laren Everts BMI: 43 MRN: [id number] Neck Size: 18.00  CLINICAL INFORMATION Sleep Study Type: Split Night CPAP  Indication for sleep study: Excessive Daytime Sleepiness, Fatigue, Hypertension, Morning Headaches, Obesity, OSA, Snoring  Epworth Sleepiness Score: 15  SLEEP STUDY TECHNIQUE As per the AASM Manual for the Scoring of Sleep and Associated Events v2.3 (April 2016) with a hypopnea requiring 4% desaturations.  The channels recorded and monitored were frontal, central and occipital EEG, electrooculogram (EOG), submentalis EMG (chin), nasal and oral airflow, thoracic and abdominal wall motion, anterior tibialis EMG, snore microphone, electrocardiogram, and pulse oximetry. Continuous positive airway pressure (CPAP) was initiated when the patient met split night criteria and was titrated according to treat sleep-disordered breathing.  MEDICATIONS Medications self-administered by patient taken the night of the study : MELATONIN, Valerian Root  RESPIRATORY PARAMETERS Diagnostic  Total AHI (/hr): 84.7  RDI (/hr):95.7  OA Index (/hr): 13.2  CA Index (/hr): 0.0 REM AHI (/hr): N/A  NREM AHI (/hr):84.7  Supine AHI (/hr):116.6  Non-supine AHI (/hr):80 Min O2 Sat (%):83.0  Mean O2 (%): 91.6  Time below 88% (min):7   Titration Optimal Pressure (cm):N/A  AHI at Optimal Pressure (/hr):N/A  Min O2 at Optimal Pressure (%):N/A Supine % at Optimal (%):N/A  Sleep % at Optimal (%):N/A   SLEEP ARCHITECTURE The recording time for the entire night was 429.5 minutes.  During a baseline period of 204.1 minutes, the patient slept for 136.0 minutes in REM and nonREM, yielding a sleep efficiency of 66.6%. Sleep onset after lights out was 9.9  minutes with a REM latency of N/A minutes. The patient spent 35.3% of the night in stage N1 sleep, 64.7% in stage N2 sleep, 0.0% in stage N3 and 0% in REM.  During the titration period of 218.9 minutes, the patient slept for 175.1 minutes in REM and nonREM, yielding a sleep efficiency of 80.0%. Sleep onset after CPAP initiation was 24.3 minutes with a REM latency of 4.5 minutes. The patient spent 7.8% of the night in stage N1 sleep, 60.2% in stage N2 sleep, 0.0% in stage N3 and 32% in REM.  CARDIAC DATA The 2 lead EKG demonstrated sinus rhythm. The mean heart rate was 100.0 beats per minute. Other EKG findings include: None.  LEG MOVEMENT DATA The total Periodic Limb Movements of Sleep (PLMS) were 0. The PLMS index was 0.0 .  IMPRESSIONS - Severe obstructive sleep apnea occurred during the diagnostic portion of the study (AHI = 84.7/hour). An optimal PAP pressure could not be selected for this patient due to ongoing respiratory events.  - No significant central sleep apnea occurred during the diagnostic portion of the study (CAI = 0.0/hour). - Moderate oxygen desaturation was noted during the diagnostic portion of the study (Min O2 =83.0%). - The patient snored with moderate snoring volume during the diagnostic portion of the study. - No cardiac abnormalities were noted during this study. - Clinically significant periodic limb movements did not occur during sleep.  DIAGNOSIS - Obstructive Sleep Apnea (G47.33)  RECOMMENDATIONS - Recommend full night PAP titration.  May need BiPAP.  - Avoid alcohol, sedatives and other CNS depressants that may worsen sleep apnea and disrupt normal sleep architecture. - Sleep hygiene should be reviewed to assess factors that may improve sleep quality. - Weight management and regular exercise should be  initiated or continued.  [Electronically signed] 07/20/2020 12:30 PM  Fransico Him MD, ABSM Diplomate, American Board of Sleep Medicine

## 2020-07-21 ENCOUNTER — Telehealth: Payer: Self-pay | Admitting: Cardiology

## 2020-07-21 NOTE — Telephone Encounter (Signed)
Patient's wife call to say that they havent received the patient cpap machine and she calling to see why. Patient's wife states that she very concerning bout her husband being that he is overweight and barley sleeps at night. She stated since his sleep studies 3 weeks ago he has had heart eposides. Wife states that it shouldn't take this long to get the machine. She would like a call back asap in regards to this. Please advise

## 2020-07-25 NOTE — Telephone Encounter (Signed)
Returned call:  The patient has been notified of the result and verbalized understanding.  All questions (if any) were answered. Latrelle Dodrill, CMA 07/25/2020 1:25 PM

## 2020-07-25 NOTE — Telephone Encounter (Addendum)
Quintella Reichert, MD  Reesa Chew, CMA Patient has severe OSA but had inadequate PAP titration. Please set up for full night CPAP titration>>may need BIPAP     The patient has been notified of the result and verbalized understanding.  All questions (if any) were answered. Latrelle Dodrill, CMA 07/25/2020 1:26 PM   Titration sent to sleep pool.

## 2020-08-11 NOTE — Telephone Encounter (Signed)
Patient is scheduled for lab study on 6/26. Patient understands his sleep study will be done at [facility]. Patient understands he will receive a sleep packet in a week or so. Patient understands to call if she does not receive the sleep packet in a timely manner. Patient agrees with treatment and thanked me for call.

## 2020-08-11 NOTE — Addendum Note (Signed)
Addended by: Reesa Chew on: 08/11/2020 09:05 AM   Modules accepted: Orders

## 2020-08-14 ENCOUNTER — Ambulatory Visit: Payer: BC Managed Care – PPO | Attending: Cardiology | Admitting: Cardiology

## 2020-08-14 ENCOUNTER — Other Ambulatory Visit: Payer: Self-pay

## 2020-08-14 DIAGNOSIS — G4733 Obstructive sleep apnea (adult) (pediatric): Secondary | ICD-10-CM | POA: Diagnosis not present

## 2020-08-19 ENCOUNTER — Telehealth: Payer: Self-pay | Admitting: Cardiology

## 2020-08-19 NOTE — Telephone Encounter (Signed)
Patient states that he had part 2 of his sleep study and wants to know if the results can be read and have a prescription sent in. He states he is waking up every hour atleast 84% of the time. In his words, he is "dying" and "exhausted". Please advise.

## 2020-08-19 NOTE — Procedures (Signed)
Patient Name: Kyle Manning, Kyle Manning Date: 08/14/2020 Gender: Male D.O.B: Feb 08, 1971 Age (years): 50 Referring Provider: Armanda Magic MD, ABSM Height (inches): 70 Interpreting Physician: Armanda Magic MD, ABSM Weight (lbs): 300 RPSGT: Alfonso Ellis BMI: 43 MRN: [id number] Neck Size: 18.00  CLINICAL INFORMATION The patient is referred for a CPAP titration to treat sleep apnea.  SLEEP STUDY TECHNIQUE As per the AASM Manual for the Scoring of Sleep and Associated Events v2.3 (April 2016) with a hypopnea requiring 4% desaturations.  The channels recorded and monitored were frontal, central and occipital EEG, electrooculogram (EOG), submentalis EMG (chin), nasal and oral airflow, thoracic and abdominal wall motion, anterior tibialis EMG, snore microphone, electrocardiogram, and pulse oximetry. Continuous positive airway pressure (CPAP) was initiated at the beginning of the study and titrated to treat sleep-disordered breathing.  MEDICATIONS Medications self-administered by patient taken the night of the study : MELATONIN, Valerian Root  TECHNICIAN COMMENTS Comments added by technician: CPAP therapy started at 5 cm of H2O, adding heated humidification. CPAP therapy increased to 19 cm of H2O. Patient had some difficulties tolerating pressure and central like events started to occur. BiLevel titration initiated due to centrals and incompliance level, starting at 19/15 cm of H2O. BiLevel increased to 20/16 cm of H2O but patient could not tolerate pressure, Bilevel decreased to 18/14 cm of H2O and events started to occur. So final pressure set at 19/15 cm of H2O for the remainder of the study. Patient tolerated CPAP and BiLevel titration very well after compliance. Patient tolerated BiLevel titration very well Comments added by scorer: N/A  RESPIRATORY PARAMETERS Optimal PAP Pressure (cm):19  AHI at Optimal Pressure (/hr):4.8 Overall Minimal O2 (%):81.0  Supine % at Optimal Pressure  (%):0 Minimal O2 at Optimal Pressure (%): 85.0   SLEEP ARCHITECTURE The study was initiated at 10:43:40 PM and ended at 6:04:26 AM.  Sleep onset time was 4.3 minutes and the sleep efficiency was 96.3%. The total sleep time was 424.5 minutes.  The patient spent 2.1% of the night in stage N1 sleep, 34.6% in stage N2 sleep, 25.3% in stage N3 and 37.9% in REM.Stage REM latency was 98.5 minutes  Wake after sleep onset was 12.0. Alpha intrusion was absent. Supine sleep was 60.19%.  CARDIAC DATA The 2 lead EKG demonstrated sinus rhythm. The mean heart rate was 70.6 beats per minute. Other EKG findings include: None.  LEG MOVEMENT DATA The total Periodic Limb Movements of Sleep (PLMS) were 0. The PLMS index was 0.0. A PLMS index of <15 is considered normal in adults.  IMPRESSIONS - Unsuccessful CPAP titration due to ongoing respiratory events and intolerance to higher CPAP pressures.  - Central sleep apnea was not noted during this titration (CAI = 3.4/h). - Moderate oxygen desaturations were observed during this titration (min O2 = 81.0%). - The patient snored with soft snoring volume during this titration study. - No cardiac abnormalities were observed during this study. - Clinically significant periodic limb movements were not noted during this study. Arousals associated with PLMs were rare.  DIAGNOSIS - Obstructive Sleep Apnea (G47.33) - Nocturnal hypoxemia  RECOMMENDATIONS - Trial of auto BiPAP therapy with IPAP max 20cm H2O, EPAP min 5cm H2O, PS 4cm H2O with a Medium size Resmed Full Face Mask AirFit F20 mask and heated humidification. - Avoid alcohol, sedatives and other CNS depressants that may worsen sleep apnea and disrupt normal sleep architecture. - Sleep hygiene should be reviewed to assess factors that may improve sleep quality. - Weight management and regular exercise  should be initiated or continued. - Return to Sleep Center for re-evaluation after 4 weeks of  therapy  [Electronically signed] 08/19/2020 01:44 PM  Armanda Magic MD, ABSM Diplomate, American Board of Sleep Medicine

## 2020-08-23 ENCOUNTER — Telehealth: Payer: Self-pay | Admitting: *Deleted

## 2020-08-23 NOTE — Telephone Encounter (Signed)
-----   Message from Quintella Reichert, MD sent at 08/19/2020  1:47 PM EDT ----- Please let patient know that they had a successful PAP titration and let DME know that orders are in EPIC.  Please set up 6 week OV with me.

## 2020-08-23 NOTE — Telephone Encounter (Signed)
The patient has been notified of the result and verbalized understanding.  All questions (if any) were answered. Latrelle Dodrill, CMA 08/23/2020 3:42 PM    Upon patient request DME selection is [facility] Patient understands he will be contacted by [facility] to set up his cpap. Patient understands to call if [facility] does not contact him with new setup in a timely manner. Patient understands they will be called once confirmation has been received from [facility] that they have received their new machine to schedule 10 week follow up appointment.   [facility] notified of new cpap order  Please add to airview Patient was grateful for the call and thanked me

## 2020-08-23 NOTE — Telephone Encounter (Signed)
The patient has been notified of the result and verbalized understanding.  All questions (if any) were answered. Latrelle Dodrill, CMA 08/23/2020 3:42 PM

## 2020-08-26 ENCOUNTER — Ambulatory Visit: Payer: BC Managed Care – PPO | Admitting: Cardiology

## 2020-08-31 NOTE — Telephone Encounter (Signed)
Called patients wife back to give her the number to [facility]. Wife says her husband needs his unit by 09/15/20. I encouraged her to call the dme for an update on the patients status.

## 2020-08-31 NOTE — Telephone Encounter (Signed)
Called [facility] 08/31/20.

## 2020-08-31 NOTE — Telephone Encounter (Signed)
Pt's wife Cordelia Pen is returning a call to [place]. Please advise pt further

## 2020-09-08 DIAGNOSIS — Z23 Encounter for immunization: Secondary | ICD-10-CM | POA: Diagnosis not present

## 2020-09-12 ENCOUNTER — Ambulatory Visit (INDEPENDENT_AMBULATORY_CARE_PROVIDER_SITE_OTHER): Payer: BC Managed Care – PPO | Admitting: Podiatry

## 2020-09-12 ENCOUNTER — Encounter: Payer: Self-pay | Admitting: Podiatry

## 2020-09-12 ENCOUNTER — Ambulatory Visit (INDEPENDENT_AMBULATORY_CARE_PROVIDER_SITE_OTHER): Payer: BC Managed Care – PPO

## 2020-09-12 ENCOUNTER — Other Ambulatory Visit: Payer: Self-pay

## 2020-09-12 DIAGNOSIS — M25571 Pain in right ankle and joints of right foot: Secondary | ICD-10-CM

## 2020-09-12 DIAGNOSIS — M76821 Posterior tibial tendinitis, right leg: Secondary | ICD-10-CM | POA: Diagnosis not present

## 2020-09-12 DIAGNOSIS — M7751 Other enthesopathy of right foot: Secondary | ICD-10-CM

## 2020-09-12 DIAGNOSIS — M722 Plantar fascial fibromatosis: Secondary | ICD-10-CM

## 2020-09-12 MED ORDER — TRIAMCINOLONE ACETONIDE 10 MG/ML IJ SUSP
10.0000 mg | Freq: Once | INTRAMUSCULAR | Status: AC
  Administered 2020-09-12: 10 mg

## 2020-09-12 MED ORDER — DICLOFENAC SODIUM 75 MG PO TBEC: 75.0000 mg | DELAYED_RELEASE_TABLET | Freq: Two times a day (BID) | ORAL | 2 refills | Status: DC

## 2020-09-12 NOTE — Patient Instructions (Signed)

## 2020-09-14 ENCOUNTER — Ambulatory Visit: Payer: BC Managed Care – PPO | Admitting: Podiatry

## 2020-09-14 NOTE — Progress Notes (Signed)
Subjective:   Patient ID: Kyle Manning, male   DOB: 50 y.o.   MRN: [id number]   HPI Patient presents stating he is developed a lot of pain in his right ankle on both the outside and the inside of the outside has been worse and states its been going on for several months.  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  Patient does not smoke likes to be active   Review of Systems  All other systems reviewed and are negative.      Objective:  Physical Exam Vitals and nursing note reviewed.  Constitutional:      Appearance: He is well-developed.  Pulmonary:     Effort: Pulmonary effort is normal.  Musculoskeletal:        General: Normal range of motion.  Skin:    General: Skin is warm.  Neurological:     Mental Status: He is alert.    Neurovascular status intact muscle strength was found to be adequate range of motion adequate.  Patient has exquisite discomfort in the sinus tarsi right with inflammation fluid buildup and is noted to have moderate discomfort on the inside of the ankle around the posterior tibial tendon as it comes under the medial malleolus.  Patient has good digital perfusion well oriented x3     Assessment:  Sinus tarsitis right with inflammation along with posterior tibial tendinitis right which is probably compensatory     Plan:  NP reviewed both conditions and x-rays.  Sterile prep injected the sinus tarsi right 3 mg Kenalog 5 mg Xylocaine and applied fascial brace to lift up the arch take pressure off the inside and placed on diclofenac twice daily for 3 weeks.  Reappoint to recheck  X-rays indicate moderate depression the arch did not indicate advanced arthritis

## 2020-09-29 ENCOUNTER — Other Ambulatory Visit: Payer: Self-pay

## 2020-09-29 ENCOUNTER — Telehealth: Payer: Self-pay | Admitting: Cardiology

## 2020-09-29 ENCOUNTER — Ambulatory Visit: Payer: BC Managed Care – PPO | Admitting: Podiatry

## 2020-09-29 ENCOUNTER — Encounter: Payer: Self-pay | Admitting: Podiatry

## 2020-09-29 DIAGNOSIS — M7751 Other enthesopathy of right foot: Secondary | ICD-10-CM

## 2020-09-29 DIAGNOSIS — M7752 Other enthesopathy of left foot: Secondary | ICD-10-CM | POA: Diagnosis not present

## 2020-09-29 DIAGNOSIS — M76821 Posterior tibial tendinitis, right leg: Secondary | ICD-10-CM

## 2020-09-29 MED ORDER — TRIAMCINOLONE ACETONIDE 10 MG/ML IJ SUSP
10.0000 mg | Freq: Once | INTRAMUSCULAR | Status: AC
  Administered 2020-09-29: 10 mg

## 2020-09-29 NOTE — Telephone Encounter (Signed)
Patient's wife is calling to call about BIPAP machine.

## 2020-09-29 NOTE — Progress Notes (Signed)
Subjective:   Patient ID: Kyle Manning, male   DOB: 50 y.o.   MRN: [id number]   HPI Patient presents stating he is improved some on the outside of his ankle but the inside of his ankle remains very sore and he does have chronic flatfoot deformity.  He has been trying to stay active but its been hard he would like to lose weight but needs to increase his activity levels.   ROS      Objective:  Physical Exam  Neurovascular status intact with inflammation around the posterior tibial tendon right as it comes under the medial malleolus with no indication of muscle dysfunction with mild discomfort also still noted in the sinus tarsi     Assessment:  Inflammatory posterior tibial tendinitis left with sinus tarsitis which may be compensatory     Plan:  H&P conditions reviewed and I have recommended the continuation of conservative treatment.  I went ahead today and were to focus on this inside of the foot and I did sterile prep and injected the posterior tibial tendon 3 mg Kenalog 5 mg Xylocaine applied air fracture walker to completely immobilize and reduce all stress on the posterior tibial tendon and casted for functional orthotics that I will buildup in order to support the arch with the hope that we can avoid MRI and surgical intervention in this case.  Reappoint for [place] to recheck when orthotics are ready or as needed

## 2020-09-29 NOTE — Telephone Encounter (Signed)
Patient's wife is calling because her husband was ordered a BiPAP machine in July and they have not heard anything in regards to getting the machine. She states that she has called [facility] and they did not have the orders. I advised patient's wife that the patient selected [facility] as his DME so the orders were sent there. I also advised her that the process to get machines takes time. Patient's wife very frustrated stating that the patient stops breathing over 80 times per night and he has severe apnea and he needs his BiPAP as soon as possible.  She is requesting that orders be sent to [facility] instead.

## 2020-09-30 NOTE — Telephone Encounter (Signed)
Reached out to patient to make certain he wanted to move to [facility] and informed him if he moved he would be at the end of [facility]'s list of patients. Patient decided to move to to [place] after speaking to [place] who told them they were getting in new Bipap machines on Monday.

## 2020-10-06 DIAGNOSIS — G4733 Obstructive sleep apnea (adult) (pediatric): Secondary | ICD-10-CM | POA: Diagnosis not present

## 2020-10-06 DIAGNOSIS — I10 Essential (primary) hypertension: Secondary | ICD-10-CM | POA: Diagnosis not present

## 2020-10-06 DIAGNOSIS — J309 Allergic rhinitis, unspecified: Secondary | ICD-10-CM | POA: Diagnosis not present

## 2020-10-16 ENCOUNTER — Encounter (HOSPITAL_BASED_OUTPATIENT_CLINIC_OR_DEPARTMENT_OTHER): Payer: BC Managed Care – PPO | Admitting: Cardiology

## 2020-10-20 DIAGNOSIS — G4733 Obstructive sleep apnea (adult) (pediatric): Secondary | ICD-10-CM | POA: Diagnosis not present

## 2020-10-20 DIAGNOSIS — I10 Essential (primary) hypertension: Secondary | ICD-10-CM | POA: Diagnosis not present

## 2020-10-26 ENCOUNTER — Telehealth: Payer: Self-pay | Admitting: *Deleted

## 2020-10-26 NOTE — Telephone Encounter (Signed)
Patient is calling because the top of his foot near ankle is bruised (nickel size), concerned that this could be something internal, will send a picture thru mychart if necessary. Please advise

## 2020-10-26 NOTE — Telephone Encounter (Signed)
Should not be anything to worry about

## 2020-10-27 NOTE — Telephone Encounter (Signed)
Returned the call to patient, gave information per Dr Charlsie Merles ,no answer thru Vmessage.

## 2020-11-01 ENCOUNTER — Telehealth: Payer: Self-pay | Admitting: Podiatry

## 2020-11-01 NOTE — Telephone Encounter (Signed)
Orthotics in.. lvm for pt to call to schedule an appt to pick them up. °

## 2020-11-11 ENCOUNTER — Encounter: Payer: Self-pay | Admitting: Podiatry

## 2020-11-11 ENCOUNTER — Ambulatory Visit (INDEPENDENT_AMBULATORY_CARE_PROVIDER_SITE_OTHER): Payer: BC Managed Care – PPO | Admitting: Podiatry

## 2020-11-11 ENCOUNTER — Other Ambulatory Visit: Payer: Self-pay

## 2020-11-11 DIAGNOSIS — M7751 Other enthesopathy of right foot: Secondary | ICD-10-CM | POA: Diagnosis not present

## 2020-11-11 DIAGNOSIS — M7752 Other enthesopathy of left foot: Secondary | ICD-10-CM | POA: Diagnosis not present

## 2020-11-11 DIAGNOSIS — M76821 Posterior tibial tendinitis, right leg: Secondary | ICD-10-CM

## 2020-11-11 MED ORDER — TRIAMCINOLONE ACETONIDE 10 MG/ML IJ SUSP
10.0000 mg | Freq: Once | INTRAMUSCULAR | Status: AC
  Administered 2020-11-11: 10 mg

## 2020-11-11 NOTE — Progress Notes (Signed)
Patient presents to pick up orthotics today.  Orthotics dispensed and patient tried these in his shoes. Patient is wearing dress shoes and these are more of a sport line orthotic. He said he wore dress shoes six days per week.  The orthotics did fit tight in his shoes.  I recommended sending the orthotics back and having them modified to more of a dress line orthotic to allow for a better fit in the shoes he wears day to day. Patient agreed.  He is also having pain in the right foot today and Dr. Charlsie Merles will evaluate this while he is here.  We will let the patient know once the modified orthotics arrive in office and have him back in to be fitted.   ~After Dr. Beverlee Nims evaluation, he advised patient to keep the sport orthotics and get another pair for his dress shoes. Dawn is going to go ahead an order those for the patient today~

## 2020-11-11 NOTE — Progress Notes (Signed)
Subjective:   Patient ID: Kyle Manning, male   DOB: 50 y.o.   MRN: [id number]   HPI Patient presents stating he is getting a lot of pain on the outside of his ankle and also he is getting need a second pair of orthotics as he has to wear different types of shoes with chronic foot pain   ROS      Objective:  Physical Exam  Neurovascular status intact with inflammation pain around the sinus tarsi right with fluid buildup posterior tib doing well with chronic foot structural issues     Assessment:  Inflammatory capsulitis right     Plan:  Sterile prep injected the sinus tarsi right 3 mg Kenalog 5 mg Xylocaine advised on a second pair of orthotics for dress shoes and he will start these orthotics and is shoes

## 2020-11-16 ENCOUNTER — Other Ambulatory Visit: Payer: Self-pay | Admitting: Podiatry

## 2020-11-19 DIAGNOSIS — G4733 Obstructive sleep apnea (adult) (pediatric): Secondary | ICD-10-CM | POA: Diagnosis not present

## 2020-11-19 DIAGNOSIS — I10 Essential (primary) hypertension: Secondary | ICD-10-CM | POA: Diagnosis not present

## 2020-12-13 DIAGNOSIS — Z1211 Encounter for screening for malignant neoplasm of colon: Secondary | ICD-10-CM | POA: Diagnosis not present

## 2020-12-13 DIAGNOSIS — K5730 Diverticulosis of large intestine without perforation or abscess without bleeding: Secondary | ICD-10-CM | POA: Diagnosis not present

## 2020-12-20 DIAGNOSIS — G4733 Obstructive sleep apnea (adult) (pediatric): Secondary | ICD-10-CM | POA: Diagnosis not present

## 2020-12-20 DIAGNOSIS — I10 Essential (primary) hypertension: Secondary | ICD-10-CM | POA: Diagnosis not present

## 2020-12-26 DIAGNOSIS — R635 Abnormal weight gain: Secondary | ICD-10-CM | POA: Diagnosis not present

## 2020-12-26 DIAGNOSIS — Z23 Encounter for immunization: Secondary | ICD-10-CM | POA: Diagnosis not present

## 2020-12-26 DIAGNOSIS — R2689 Other abnormalities of gait and mobility: Secondary | ICD-10-CM | POA: Diagnosis not present

## 2020-12-27 ENCOUNTER — Telehealth: Payer: Self-pay | Admitting: Podiatry

## 2020-12-27 NOTE — Telephone Encounter (Signed)
Pts wife left message checking on pts orthotics that were ordered about 6 wks ago.   Upon checking they were sent to [place] again and we sent them back to be corrected as they were not the dress style that was needed. I left message for pts wife that they should be coming in this week and I would call pt to let him know when they are in.

## 2021-01-04 ENCOUNTER — Telehealth: Payer: Self-pay | Admitting: Podiatry

## 2021-01-04 NOTE — Telephone Encounter (Signed)
2nd pr orthotics in.. lvm for pt ok to pick up or if pt wanted to have an appt to call to me back to schedule .

## 2021-01-06 ENCOUNTER — Telehealth: Payer: Self-pay | Admitting: Podiatry

## 2021-01-06 NOTE — Telephone Encounter (Signed)
Pt left message stating he received my call about his orthotics being in and wanted to make sure it was ok to pick them up around 3 today.  Pt also spoke to [facility] later in the am and I she told pt it was ok for him to pick them up around 3 today.

## 2021-01-18 ENCOUNTER — Other Ambulatory Visit: Payer: Self-pay

## 2021-01-18 ENCOUNTER — Ambulatory Visit (INDEPENDENT_AMBULATORY_CARE_PROVIDER_SITE_OTHER): Payer: BC Managed Care – PPO | Admitting: Podiatry

## 2021-01-18 DIAGNOSIS — M7751 Other enthesopathy of right foot: Secondary | ICD-10-CM | POA: Diagnosis not present

## 2021-01-18 DIAGNOSIS — M1A0710 Idiopathic chronic gout, right ankle and foot, without tophus (tophi): Secondary | ICD-10-CM

## 2021-01-18 MED ORDER — TRIAMCINOLONE ACETONIDE 10 MG/ML IJ SUSP
10.0000 mg | Freq: Once | INTRAMUSCULAR | Status: AC
  Administered 2021-01-18: 10 mg

## 2021-01-18 NOTE — Progress Notes (Signed)
Subjective:   Patient ID: Kyle Manning, male   DOB: 50 y.o.   MRN: [id number]   HPI Patient presents stating my ankle has started to hurt again and I did not have my orthotics for a little while I seem to be better now but still sore and it seems like it flares up and occurs at different times neuro   ROS      Objective:  Physical Exam  Vascular status unchanged mild swelling in the right ankle and into the sinus tarsi with patient who appears to get flareups associated with this     Assessment:  May be an inflamed capsule versus possibility of gout or other bony or joint abnormality     Plan:  H&P going to try 1 more time sinus tarsi injection right and into the ankle joint 3 mg Kenalog 5 mg Xylocaine and I am sending him for blood work to rule out gout or inflammatory systemic disease.  May need to consider MRI and I did discuss with him again weight loss

## 2021-01-19 ENCOUNTER — Ambulatory Visit: Payer: BC Managed Care – PPO | Admitting: Podiatry

## 2021-01-19 DIAGNOSIS — G4733 Obstructive sleep apnea (adult) (pediatric): Secondary | ICD-10-CM | POA: Diagnosis not present

## 2021-01-19 DIAGNOSIS — I10 Essential (primary) hypertension: Secondary | ICD-10-CM | POA: Diagnosis not present

## 2021-01-20 LAB — RHEUMATOID ARTHRITIS DIAGNOSTIC PANEL
Cyclic Citrullin Peptide Ab: <16 UNITS
Rhuematoid fact SerPl-aCnc: <14 IU/mL (ref <14)

## 2021-01-20 LAB — ANA, IFA COMPREHENSIVE PANEL
Anti Nuclear Antibody (ANA): NEGATIVE
ENA SM Ab Ser-aCnc: <1 AI
SM/RNP: <1 AI
SSA (Ro) (ENA) Antibody, IgG: <1 AI
SSB (La) (ENA) Antibody, IgG: <1 AI
Scleroderma (Scl-70) (ENA) Antibody, IgG: <1 AI
ds DNA Ab: <1 IU/mL

## 2021-01-20 LAB — SEDIMENTATION RATE: Sed Rate: 11 mm/h (ref 0–15)

## 2021-01-20 LAB — CBC WITH DIFFERENTIAL/PLATELET
Absolute Monocytes: 669 cells/uL (ref 200–950)
Basophils Absolute: 62 cells/uL (ref 0–200)
Basophils Relative: 0.7 %
Eosinophils Absolute: 211 cells/uL (ref 15–500)
Eosinophils Relative: 2.4 %
HCT: 41.9 % (ref 38.5–50.0)
Hemoglobin: 14.1 g/dL (ref 13.2–17.1)
Lymphs Abs: 3538 cells/uL (ref 850–3900)
MCH: 31.9 pg (ref 27.0–33.0)
MCHC: 33.7 g/dL (ref 32.0–36.0)
MCV: 94.8 fL (ref 80.0–100.0)
MPV: 10.3 fL (ref 7.5–12.5)
Monocytes Relative: 7.6 %
Neutro Abs: 4321 cells/uL (ref 1500–7800)
Neutrophils Relative %: 49.1 %
Platelets: 272 10*3/uL (ref 140–400)
RBC: 4.42 10*6/uL (ref 4.20–5.80)
RDW: 12.1 % (ref 11.0–15.0)
Total Lymphocyte: 40.2 %
WBC: 8.8 10*3/uL (ref 3.8–10.8)

## 2021-01-20 LAB — URIC ACID: Uric Acid, Serum: 5.8 mg/dL (ref 4.0–8.0)

## 2021-01-20 LAB — C-REACTIVE PROTEIN: CRP: 16.1 mg/L — ABNORMAL HIGH (ref <8.0)

## 2021-01-28 ENCOUNTER — Other Ambulatory Visit: Payer: Self-pay | Admitting: Podiatry

## 2021-01-29 NOTE — Telephone Encounter (Signed)
Please advise 

## 2021-02-01 ENCOUNTER — Other Ambulatory Visit: Payer: Self-pay

## 2021-02-01 ENCOUNTER — Ambulatory Visit (INDEPENDENT_AMBULATORY_CARE_PROVIDER_SITE_OTHER): Payer: BC Managed Care – PPO | Admitting: Podiatry

## 2021-02-01 ENCOUNTER — Encounter: Payer: Self-pay | Admitting: Podiatry

## 2021-02-01 DIAGNOSIS — M7751 Other enthesopathy of right foot: Secondary | ICD-10-CM | POA: Diagnosis not present

## 2021-02-01 MED ORDER — TRIAMCINOLONE ACETONIDE 10 MG/ML IJ SUSP
10.0000 mg | Freq: Once | INTRAMUSCULAR | Status: AC
  Administered 2021-02-01: 17:00:00 10 mg

## 2021-02-02 NOTE — Progress Notes (Signed)
Subjective:   Patient ID: Kyle Manning, male   DOB: 50 y.o.   MRN: [id number]   HPI Patient states I have been trying to lose weight but I develop still some discomfort in his ankle which makes it hard for me to be active and I would love to have 1 more cortisone shot if possible   ROS      Objective:  Physical Exam  Neurovascular status intact with patient's medial ankle doing very well after being treated for posterior tibial tendinitis with inflammation its not as much sinus tarsi now as it is ankle joint     Assessment:  Continued inflammatory condition with obesity is complicating factor that is moved more into the ankle joint versus the sinus tarsi     Plan:  H&P reviewed and I am good I do want a careful ankle joint injection 3 mg Kenalog 5 mg Xylocaine from the lateral side to reduce the inflammation and advised on the importance of weight loss and to continue his medications.  Reappoint as indicated or needed

## 2021-02-06 ENCOUNTER — Telehealth: Payer: Self-pay | Admitting: Podiatry

## 2021-02-06 NOTE — Telephone Encounter (Signed)
Pt left message and had a few questions about the orthotics he has gotten in the last several months.   I returned call and he stated they make his feet sweat what can he do about that. I told him if needed we could change the top cover to leather so they do no absorb the sweat as bad. He also asked about cleaning them. I told him to dampen the orthotics(Do not soak or submerge) and use a mild soap and toothbrush to clean them and let them air dry. He said thank you

## 2021-02-19 DIAGNOSIS — G4733 Obstructive sleep apnea (adult) (pediatric): Secondary | ICD-10-CM | POA: Diagnosis not present

## 2021-02-19 DIAGNOSIS — I10 Essential (primary) hypertension: Secondary | ICD-10-CM | POA: Diagnosis not present

## 2021-03-22 DIAGNOSIS — G4733 Obstructive sleep apnea (adult) (pediatric): Secondary | ICD-10-CM | POA: Diagnosis not present

## 2021-03-22 DIAGNOSIS — I10 Essential (primary) hypertension: Secondary | ICD-10-CM | POA: Diagnosis not present

## 2021-04-06 ENCOUNTER — Other Ambulatory Visit: Payer: Self-pay | Admitting: Podiatry

## 2021-04-06 NOTE — Telephone Encounter (Signed)
Please advise 

## 2021-04-10 DIAGNOSIS — Z125 Encounter for screening for malignant neoplasm of prostate: Secondary | ICD-10-CM | POA: Diagnosis not present

## 2021-04-10 DIAGNOSIS — I10 Essential (primary) hypertension: Secondary | ICD-10-CM | POA: Diagnosis not present

## 2021-04-10 DIAGNOSIS — E7800 Pure hypercholesterolemia, unspecified: Secondary | ICD-10-CM | POA: Diagnosis not present

## 2021-04-10 DIAGNOSIS — Z0000 Encounter for general adult medical examination without abnormal findings: Secondary | ICD-10-CM | POA: Diagnosis not present

## 2021-04-19 DIAGNOSIS — I10 Essential (primary) hypertension: Secondary | ICD-10-CM | POA: Diagnosis not present

## 2021-04-19 DIAGNOSIS — M25571 Pain in right ankle and joints of right foot: Secondary | ICD-10-CM | POA: Diagnosis not present

## 2021-04-19 DIAGNOSIS — G4733 Obstructive sleep apnea (adult) (pediatric): Secondary | ICD-10-CM | POA: Diagnosis not present

## 2021-04-27 DIAGNOSIS — M25571 Pain in right ankle and joints of right foot: Secondary | ICD-10-CM | POA: Diagnosis not present

## 2021-05-05 DIAGNOSIS — M25571 Pain in right ankle and joints of right foot: Secondary | ICD-10-CM | POA: Diagnosis not present

## 2021-05-05 DIAGNOSIS — M76821 Posterior tibial tendinitis, right leg: Secondary | ICD-10-CM | POA: Diagnosis not present

## 2021-05-05 DIAGNOSIS — M25371 Other instability, right ankle: Secondary | ICD-10-CM | POA: Diagnosis not present

## 2021-05-11 DIAGNOSIS — M25571 Pain in right ankle and joints of right foot: Secondary | ICD-10-CM | POA: Diagnosis not present

## 2021-05-11 DIAGNOSIS — R262 Difficulty in walking, not elsewhere classified: Secondary | ICD-10-CM | POA: Diagnosis not present

## 2021-05-11 DIAGNOSIS — M6281 Muscle weakness (generalized): Secondary | ICD-10-CM | POA: Diagnosis not present

## 2021-05-18 ENCOUNTER — Ambulatory Visit: Payer: BC Managed Care – PPO | Admitting: Podiatry

## 2021-05-18 ENCOUNTER — Encounter: Payer: Self-pay | Admitting: Podiatry

## 2021-05-18 DIAGNOSIS — M6281 Muscle weakness (generalized): Secondary | ICD-10-CM | POA: Diagnosis not present

## 2021-05-18 DIAGNOSIS — L600 Ingrowing nail: Secondary | ICD-10-CM | POA: Diagnosis not present

## 2021-05-18 DIAGNOSIS — M7751 Other enthesopathy of right foot: Secondary | ICD-10-CM

## 2021-05-18 DIAGNOSIS — R262 Difficulty in walking, not elsewhere classified: Secondary | ICD-10-CM | POA: Diagnosis not present

## 2021-05-18 DIAGNOSIS — M25571 Pain in right ankle and joints of right foot: Secondary | ICD-10-CM | POA: Diagnosis not present

## 2021-05-18 NOTE — Progress Notes (Signed)
Subjective:  ? ?Patient ID: Kyle Manning, male   DOB: 51 y.o.   MRN: [id number]  ? ?HPI ?Patient presents stating he has chronic ingrown toenails on his big toes both feet which are sore and his ankles have been hurting because he has been moving but he is not wanting those treated today neuro ? ? ?ROS ? ? ?   ?Objective:  ?Physical Exam  ?Vascular status intact with patient found to have incurvation of the medial borders of the big toe bilateral that are painful when pressed and making shoe gear difficult.  Patient is found to have good digital perfusion well oriented mild discomfort in the capsule of the right over left foot ? ?   ?Assessment:  ?Chronic ingrown toenail deformity with pain medial border hallux right over left and inflammatory condition with obesity is complicating factor ? ?   ?Plan:  ?H&P reviewed conditions recommended correction of ingrown toenails explained procedure risk and patient wants surgery.  He signed consent form after review and today I infiltrated each hallux 60 mg Xylocaine Marcaine mixture sterile prep done and using sterile instrumentation I remove the medial borders I exposed matrix I applied phenol 3 applications 30 seconds followed by alcohol lavage sterile dressing I gave instructions on soaks and leave dressings on 24 hours but take them off earlier if throbbing were to occur and encouraged to call with questions concerns which may arise ?   ? ? ?

## 2021-05-18 NOTE — Patient Instructions (Signed)

## 2021-05-20 DIAGNOSIS — G4733 Obstructive sleep apnea (adult) (pediatric): Secondary | ICD-10-CM | POA: Diagnosis not present

## 2021-05-20 DIAGNOSIS — I10 Essential (primary) hypertension: Secondary | ICD-10-CM | POA: Diagnosis not present

## 2021-05-24 DIAGNOSIS — M25571 Pain in right ankle and joints of right foot: Secondary | ICD-10-CM | POA: Diagnosis not present

## 2021-05-24 DIAGNOSIS — M6281 Muscle weakness (generalized): Secondary | ICD-10-CM | POA: Diagnosis not present

## 2021-05-24 DIAGNOSIS — R262 Difficulty in walking, not elsewhere classified: Secondary | ICD-10-CM | POA: Diagnosis not present

## 2021-05-31 DIAGNOSIS — M25571 Pain in right ankle and joints of right foot: Secondary | ICD-10-CM | POA: Diagnosis not present

## 2021-05-31 DIAGNOSIS — M6281 Muscle weakness (generalized): Secondary | ICD-10-CM | POA: Diagnosis not present

## 2021-05-31 DIAGNOSIS — R262 Difficulty in walking, not elsewhere classified: Secondary | ICD-10-CM | POA: Diagnosis not present

## 2021-06-08 DIAGNOSIS — R262 Difficulty in walking, not elsewhere classified: Secondary | ICD-10-CM | POA: Diagnosis not present

## 2021-06-08 DIAGNOSIS — M25571 Pain in right ankle and joints of right foot: Secondary | ICD-10-CM | POA: Diagnosis not present

## 2021-06-08 DIAGNOSIS — M6281 Muscle weakness (generalized): Secondary | ICD-10-CM | POA: Diagnosis not present

## 2021-06-12 DIAGNOSIS — M25571 Pain in right ankle and joints of right foot: Secondary | ICD-10-CM | POA: Diagnosis not present

## 2021-06-12 DIAGNOSIS — M6281 Muscle weakness (generalized): Secondary | ICD-10-CM | POA: Diagnosis not present

## 2021-06-12 DIAGNOSIS — R262 Difficulty in walking, not elsewhere classified: Secondary | ICD-10-CM | POA: Diagnosis not present

## 2021-06-14 DIAGNOSIS — M6281 Muscle weakness (generalized): Secondary | ICD-10-CM | POA: Diagnosis not present

## 2021-06-14 DIAGNOSIS — R262 Difficulty in walking, not elsewhere classified: Secondary | ICD-10-CM | POA: Diagnosis not present

## 2021-06-14 DIAGNOSIS — M25571 Pain in right ankle and joints of right foot: Secondary | ICD-10-CM | POA: Diagnosis not present

## 2021-06-19 DIAGNOSIS — G4733 Obstructive sleep apnea (adult) (pediatric): Secondary | ICD-10-CM | POA: Diagnosis not present

## 2021-06-19 DIAGNOSIS — I10 Essential (primary) hypertension: Secondary | ICD-10-CM | POA: Diagnosis not present

## 2021-06-27 ENCOUNTER — Other Ambulatory Visit: Payer: Self-pay | Admitting: Podiatry

## 2021-06-27 NOTE — Telephone Encounter (Signed)
Please advise 

## 2021-06-29 DIAGNOSIS — M66879 Spontaneous rupture of other tendons, unspecified ankle and foot: Secondary | ICD-10-CM | POA: Diagnosis not present

## 2021-07-06 DIAGNOSIS — M89363 Hypertrophy of bone, right fibula: Secondary | ICD-10-CM | POA: Diagnosis not present

## 2021-07-06 DIAGNOSIS — M25871 Other specified joint disorders, right ankle and foot: Secondary | ICD-10-CM | POA: Diagnosis not present

## 2021-07-06 DIAGNOSIS — M19071 Primary osteoarthritis, right ankle and foot: Secondary | ICD-10-CM | POA: Diagnosis not present

## 2021-07-06 DIAGNOSIS — M25371 Other instability, right ankle: Secondary | ICD-10-CM | POA: Diagnosis not present

## 2021-07-11 DIAGNOSIS — F411 Generalized anxiety disorder: Secondary | ICD-10-CM | POA: Diagnosis not present

## 2021-07-19 DIAGNOSIS — F411 Generalized anxiety disorder: Secondary | ICD-10-CM | POA: Diagnosis not present

## 2021-07-20 DIAGNOSIS — I10 Essential (primary) hypertension: Secondary | ICD-10-CM | POA: Diagnosis not present

## 2021-07-20 DIAGNOSIS — G4733 Obstructive sleep apnea (adult) (pediatric): Secondary | ICD-10-CM | POA: Diagnosis not present

## 2021-07-21 DIAGNOSIS — M25571 Pain in right ankle and joints of right foot: Secondary | ICD-10-CM | POA: Diagnosis not present

## 2021-07-21 DIAGNOSIS — M19071 Primary osteoarthritis, right ankle and foot: Secondary | ICD-10-CM | POA: Diagnosis not present

## 2021-07-21 DIAGNOSIS — M25371 Other instability, right ankle: Secondary | ICD-10-CM | POA: Diagnosis not present

## 2021-07-26 DIAGNOSIS — F411 Generalized anxiety disorder: Secondary | ICD-10-CM | POA: Diagnosis not present

## 2021-08-01 DIAGNOSIS — M7751 Other enthesopathy of right foot: Secondary | ICD-10-CM | POA: Diagnosis not present

## 2021-08-02 DIAGNOSIS — M19071 Primary osteoarthritis, right ankle and foot: Secondary | ICD-10-CM | POA: Diagnosis not present

## 2021-08-02 DIAGNOSIS — Z9889 Other specified postprocedural states: Secondary | ICD-10-CM | POA: Diagnosis not present

## 2021-08-03 DIAGNOSIS — R21 Rash and other nonspecific skin eruption: Secondary | ICD-10-CM | POA: Diagnosis not present

## 2021-08-19 DIAGNOSIS — G4733 Obstructive sleep apnea (adult) (pediatric): Secondary | ICD-10-CM | POA: Diagnosis not present

## 2021-08-19 DIAGNOSIS — I10 Essential (primary) hypertension: Secondary | ICD-10-CM | POA: Diagnosis not present

## 2021-08-31 DIAGNOSIS — M7751 Other enthesopathy of right foot: Secondary | ICD-10-CM | POA: Diagnosis not present

## 2021-09-01 DIAGNOSIS — M19071 Primary osteoarthritis, right ankle and foot: Secondary | ICD-10-CM | POA: Diagnosis not present

## 2021-09-07 DIAGNOSIS — S93401D Sprain of unspecified ligament of right ankle, subsequent encounter: Secondary | ICD-10-CM | POA: Diagnosis not present

## 2021-09-07 DIAGNOSIS — M19071 Primary osteoarthritis, right ankle and foot: Secondary | ICD-10-CM | POA: Diagnosis not present

## 2021-09-12 DIAGNOSIS — M19071 Primary osteoarthritis, right ankle and foot: Secondary | ICD-10-CM | POA: Diagnosis not present

## 2021-09-12 DIAGNOSIS — S93401D Sprain of unspecified ligament of right ankle, subsequent encounter: Secondary | ICD-10-CM | POA: Diagnosis not present

## 2021-09-15 DIAGNOSIS — M19071 Primary osteoarthritis, right ankle and foot: Secondary | ICD-10-CM | POA: Diagnosis not present

## 2021-09-15 DIAGNOSIS — S93401D Sprain of unspecified ligament of right ankle, subsequent encounter: Secondary | ICD-10-CM | POA: Diagnosis not present

## 2021-09-19 DIAGNOSIS — S93104D Unspecified dislocation of right toe(s), subsequent encounter: Secondary | ICD-10-CM | POA: Diagnosis not present

## 2021-09-19 DIAGNOSIS — M19071 Primary osteoarthritis, right ankle and foot: Secondary | ICD-10-CM | POA: Diagnosis not present

## 2021-09-21 DIAGNOSIS — S93401D Sprain of unspecified ligament of right ankle, subsequent encounter: Secondary | ICD-10-CM | POA: Diagnosis not present

## 2021-09-21 DIAGNOSIS — M19071 Primary osteoarthritis, right ankle and foot: Secondary | ICD-10-CM | POA: Diagnosis not present

## 2021-09-26 DIAGNOSIS — S93401D Sprain of unspecified ligament of right ankle, subsequent encounter: Secondary | ICD-10-CM | POA: Diagnosis not present

## 2021-09-26 DIAGNOSIS — M19071 Primary osteoarthritis, right ankle and foot: Secondary | ICD-10-CM | POA: Diagnosis not present

## 2021-09-28 DIAGNOSIS — S93401D Sprain of unspecified ligament of right ankle, subsequent encounter: Secondary | ICD-10-CM | POA: Diagnosis not present

## 2021-09-28 DIAGNOSIS — M19071 Primary osteoarthritis, right ankle and foot: Secondary | ICD-10-CM | POA: Diagnosis not present

## 2021-10-01 DIAGNOSIS — M7751 Other enthesopathy of right foot: Secondary | ICD-10-CM | POA: Diagnosis not present

## 2021-10-03 DIAGNOSIS — S93401D Sprain of unspecified ligament of right ankle, subsequent encounter: Secondary | ICD-10-CM | POA: Diagnosis not present

## 2021-10-03 DIAGNOSIS — M19071 Primary osteoarthritis, right ankle and foot: Secondary | ICD-10-CM | POA: Diagnosis not present

## 2021-10-05 DIAGNOSIS — M19071 Primary osteoarthritis, right ankle and foot: Secondary | ICD-10-CM | POA: Diagnosis not present

## 2021-10-05 DIAGNOSIS — S93401D Sprain of unspecified ligament of right ankle, subsequent encounter: Secondary | ICD-10-CM | POA: Diagnosis not present

## 2021-10-06 DIAGNOSIS — I10 Essential (primary) hypertension: Secondary | ICD-10-CM | POA: Diagnosis not present

## 2021-10-06 DIAGNOSIS — J309 Allergic rhinitis, unspecified: Secondary | ICD-10-CM | POA: Diagnosis not present

## 2021-10-06 DIAGNOSIS — E7800 Pure hypercholesterolemia, unspecified: Secondary | ICD-10-CM | POA: Diagnosis not present

## 2021-10-06 DIAGNOSIS — G4733 Obstructive sleep apnea (adult) (pediatric): Secondary | ICD-10-CM | POA: Diagnosis not present

## 2021-10-12 DIAGNOSIS — M19071 Primary osteoarthritis, right ankle and foot: Secondary | ICD-10-CM | POA: Diagnosis not present

## 2021-10-12 DIAGNOSIS — S93401D Sprain of unspecified ligament of right ankle, subsequent encounter: Secondary | ICD-10-CM | POA: Diagnosis not present

## 2021-10-13 DIAGNOSIS — M19071 Primary osteoarthritis, right ankle and foot: Secondary | ICD-10-CM | POA: Diagnosis not present

## 2021-10-19 DIAGNOSIS — S93401D Sprain of unspecified ligament of right ankle, subsequent encounter: Secondary | ICD-10-CM | POA: Diagnosis not present

## 2021-10-19 DIAGNOSIS — M19071 Primary osteoarthritis, right ankle and foot: Secondary | ICD-10-CM | POA: Diagnosis not present

## 2021-10-26 DIAGNOSIS — M19071 Primary osteoarthritis, right ankle and foot: Secondary | ICD-10-CM | POA: Diagnosis not present

## 2021-10-26 DIAGNOSIS — S93401D Sprain of unspecified ligament of right ankle, subsequent encounter: Secondary | ICD-10-CM | POA: Diagnosis not present

## 2021-11-01 DIAGNOSIS — M7751 Other enthesopathy of right foot: Secondary | ICD-10-CM | POA: Diagnosis not present

## 2021-11-13 DIAGNOSIS — M19071 Primary osteoarthritis, right ankle and foot: Secondary | ICD-10-CM | POA: Diagnosis not present

## 2021-11-13 DIAGNOSIS — S93401D Sprain of unspecified ligament of right ankle, subsequent encounter: Secondary | ICD-10-CM | POA: Diagnosis not present

## 2021-11-15 DIAGNOSIS — M19071 Primary osteoarthritis, right ankle and foot: Secondary | ICD-10-CM | POA: Diagnosis not present

## 2021-11-15 DIAGNOSIS — S93401D Sprain of unspecified ligament of right ankle, subsequent encounter: Secondary | ICD-10-CM | POA: Diagnosis not present

## 2021-12-01 DIAGNOSIS — M7751 Other enthesopathy of right foot: Secondary | ICD-10-CM | POA: Diagnosis not present

## 2022-01-01 DIAGNOSIS — M7751 Other enthesopathy of right foot: Secondary | ICD-10-CM | POA: Diagnosis not present

## 2022-01-28 ENCOUNTER — Other Ambulatory Visit: Payer: Self-pay | Admitting: Podiatry

## 2022-01-31 DIAGNOSIS — M7751 Other enthesopathy of right foot: Secondary | ICD-10-CM | POA: Diagnosis not present

## 2022-01-31 NOTE — Telephone Encounter (Signed)
No refill. Should be seen if having pain

## 2022-03-03 DIAGNOSIS — M7751 Other enthesopathy of right foot: Secondary | ICD-10-CM | POA: Diagnosis not present

## 2022-04-03 DIAGNOSIS — M7751 Other enthesopathy of right foot: Secondary | ICD-10-CM | POA: Diagnosis not present

## 2022-04-11 DIAGNOSIS — I10 Essential (primary) hypertension: Secondary | ICD-10-CM | POA: Diagnosis not present

## 2022-04-11 DIAGNOSIS — E7800 Pure hypercholesterolemia, unspecified: Secondary | ICD-10-CM | POA: Diagnosis not present

## 2022-04-11 DIAGNOSIS — Z0000 Encounter for general adult medical examination without abnormal findings: Secondary | ICD-10-CM | POA: Diagnosis not present

## 2022-04-11 DIAGNOSIS — Z125 Encounter for screening for malignant neoplasm of prostate: Secondary | ICD-10-CM | POA: Diagnosis not present

## 2022-04-25 ENCOUNTER — Other Ambulatory Visit (HOSPITAL_COMMUNITY): Payer: Self-pay | Admitting: Family Medicine

## 2022-04-25 DIAGNOSIS — G459 Transient cerebral ischemic attack, unspecified: Secondary | ICD-10-CM

## 2022-04-25 DIAGNOSIS — R531 Weakness: Secondary | ICD-10-CM | POA: Diagnosis not present

## 2022-04-26 ENCOUNTER — Encounter (HOSPITAL_COMMUNITY): Payer: Self-pay

## 2022-04-26 ENCOUNTER — Ambulatory Visit (HOSPITAL_COMMUNITY): Payer: BC Managed Care – PPO

## 2022-04-27 ENCOUNTER — Encounter: Payer: Self-pay | Admitting: Neurology

## 2022-04-30 ENCOUNTER — Other Ambulatory Visit (HOSPITAL_COMMUNITY): Payer: Self-pay | Admitting: Family Medicine

## 2022-04-30 DIAGNOSIS — G459 Transient cerebral ischemic attack, unspecified: Secondary | ICD-10-CM

## 2022-05-02 DIAGNOSIS — M7751 Other enthesopathy of right foot: Secondary | ICD-10-CM | POA: Diagnosis not present

## 2022-05-03 ENCOUNTER — Ambulatory Visit (HOSPITAL_COMMUNITY): Payer: BC Managed Care – PPO | Attending: Family Medicine

## 2022-05-03 DIAGNOSIS — G459 Transient cerebral ischemic attack, unspecified: Secondary | ICD-10-CM | POA: Diagnosis not present

## 2022-05-03 LAB — ECHOCARDIOGRAM COMPLETE BUBBLE STUDY
Area-P 1/2: 3.81 cm2
S' Lateral: 3.3 cm

## 2022-05-03 NOTE — Progress Notes (Signed)
NEUROLOGY CONSULTATION NOTE  LEONARD FAGNANI MRN: [id number] DOB: 1970/08/13  Referring provider: Gaynelle Arabian, MD Primary care provider: Gaynelle Arabian, MD  Reason for consult:  stroke  Assessment/Plan:   Left ischemic cortical infarct within the precentral gyrus, likely embolic of unknown source. Hypertension Hyperlipidemia Obstructive sleep apnea   Stroke workup: CTA HEAD & NECK 30 day cardiac event monitor.  If negative, consider implantable loop recorder. Check Hgb A1c Secondary stroke prevention: ASA 81mg  and Plavix 75mg  daily for 21 days followed by ASA 81mg  daily monotherapy Start atorvastatin 80mg  daily.  Repeat lipid panel (with LFTs) in 3 months.  LDL goal less than 70 Normotensive blood pressure Hgb A1c goal less than 7 Mediterranean diet CPAP at night Routine exercise Follow up 5 months.   Subjective:  Kyle Manning is a 52 year old right-handed male with hypertension, HLD and OSA who presents for stroke.  History supplemented by referring provider's note.  On 3/6, he woke up that morning and noticed that the index finger and thumb of his right hand felt funny. He had trouble buttoning up his shirt.  When he got to work a little later, he noted difficulty typing and using the mouse at his computer.  On a couple of occasions, he briefly had speech difficulty, each lasting several seconds.  One time, he had trouble getting words out.  Another time, he interchanged words (such as saying "chair red" instead of "red chair").  Symptoms lasted about 2 hours.  Later, he had trouble again with his hand, difficulty straightening his right index finger.  This lasted about 15 minutes.   He saw his PCP who suspected TIA and started him on an ASA.  MRI of brain with and without contrast on 3/7 showed small focus of faint diffusion restriction within the left precentral gyrus lateral to the hand motor knob concerning for small acute to early subacute ischemic  infarct.  2D echo with bubble study on 3/14 showed LVEF 59% with negative bubble study.  Bilateral carotid ultrasound on 3/15 showed no hemodynamically significant stenosis.  He had recently had routine labs checked by his PCP, including a lipid panel that revealed total chol 196, TG 132 and LDL 126.  Sometimes his right hand may become numb with use, such as while typing, but that is nothing new (he had that for years) and he will just shake it out.  No recurrence of speech difficulty.  Denies palpitations.     Current medications:  Plavix 75mg , lisinopril, bisoprolol-HCTZ  PAST MEDICAL HISTORY: Past Medical History:  Diagnosis Date   HSV (herpes simplex virus) anogenital infection    Hypertension    Kidney stone    OSA (obstructive sleep apnea)    Pollen allergy     PAST SURGICAL HISTORY: Past Surgical History:  Procedure Laterality Date   APPENDECTOMY     LUMBAR DISC SURGERY     ROTATOR CUFF REPAIR      MEDICATIONS: Current Outpatient Medications on File Prior to Visit  Medication Sig Dispense Refill   bisoprolol-hydrochlorothiazide (ZIAC) 5-6.25 MG per tablet Take 1 tablet by mouth daily.     cetirizine (ZYRTEC) 10 MG tablet Take 10 mg by mouth daily.     diclofenac (VOLTAREN) 75 MG EC tablet TAKE 1 TABLET BY MOUTH TWICE A DAY 50 tablet 2   fluticasone (FLONASE) 50 MCG/ACT nasal spray Place into both nostrils daily.     ibuprofen (ADVIL,MOTRIN) 200 MG tablet Take 400 mg by mouth daily.  lisinopril (ZESTRIL) 10 MG tablet Take 10 mg by mouth daily.     Multiple Vitamin (MULTIVITAMIN) capsule Take 1 capsule by mouth daily.     No current facility-administered medications on file prior to visit.    ALLERGIES: No Known Allergies  FAMILY HISTORY: Family History  Problem Relation Age of Onset   Hyperlipidemia Mother    Glaucoma Mother    Prostate cancer Father    Diabetes Father     Objective:  Blood pressure (!) 144/87, pulse 76, height 5\' 7"  (1.702 m), weight (!)  317 lb 12.8 oz (144.2 kg), SpO2 93 %. General: No acute distress.  Patient appears well-groomed.   Head:  Normocephalic/atraumatic Eyes:  fundi examined but not visualized Neck: supple, no paraspinal tenderness, full range of motion Back: No paraspinal tenderness Heart: regular rate and rhythm Lungs: Clear to auscultation bilaterally. Vascular: No carotid bruits. Neurological Exam: Mental status: alert and oriented to person, place, and time, speech fluent and not dysarthric, language intact. Cranial nerves: CN I: not tested CN II: pupils equal, round and reactive to light, visual fields intact CN III, IV, VI:  full range of motion, no nystagmus, no ptosis CN V: facial sensation intact. CN VII: upper and lower face symmetric CN VIII: hearing intact CN IX, X: gag intact, uvula midline CN XI: sternocleidomastoid and trapezius muscles intact CN XII: tongue midline Bulk & Tone: normal, no fasciculations. Motor:  muscle strength 5/5 throughout Sensation:  Temperature and vibratory sensation intact. Deep Tendon Reflexes:  2+ throughout,  toes downgoing.   Finger to nose testing:  Without dysmetria.   Heel to shin:  Without dysmetria.   Gait:  Normal station and stride.  Romberg negative.    Thank you for allowing me to take part in the care of this patient.  Kyle Clines, DO  CC: Gaynelle Arabian, MD

## 2022-05-04 DIAGNOSIS — G459 Transient cerebral ischemic attack, unspecified: Secondary | ICD-10-CM | POA: Diagnosis not present

## 2022-05-07 ENCOUNTER — Ambulatory Visit: Payer: BC Managed Care – PPO | Admitting: Neurology

## 2022-05-07 ENCOUNTER — Other Ambulatory Visit (INDEPENDENT_AMBULATORY_CARE_PROVIDER_SITE_OTHER): Payer: BC Managed Care – PPO

## 2022-05-07 ENCOUNTER — Other Ambulatory Visit: Payer: Self-pay | Admitting: Neurology

## 2022-05-07 ENCOUNTER — Encounter: Payer: Self-pay | Admitting: Neurology

## 2022-05-07 VITALS — BP 144/87 | HR 76 | Ht 67.0 in | Wt 317.8 lb

## 2022-05-07 DIAGNOSIS — Z131 Encounter for screening for diabetes mellitus: Secondary | ICD-10-CM

## 2022-05-07 DIAGNOSIS — I10 Essential (primary) hypertension: Secondary | ICD-10-CM

## 2022-05-07 DIAGNOSIS — G4733 Obstructive sleep apnea (adult) (pediatric): Secondary | ICD-10-CM

## 2022-05-07 DIAGNOSIS — I4891 Unspecified atrial fibrillation: Secondary | ICD-10-CM

## 2022-05-07 DIAGNOSIS — E785 Hyperlipidemia, unspecified: Secondary | ICD-10-CM | POA: Diagnosis not present

## 2022-05-07 DIAGNOSIS — I63412 Cerebral infarction due to embolism of left middle cerebral artery: Secondary | ICD-10-CM

## 2022-05-07 LAB — LIPID PANEL
Cholesterol: 201 mg/dL — ABNORMAL HIGH (ref 0–200)
HDL: 52 mg/dL (ref >39.00)
LDL Cholesterol: 124 mg/dL — ABNORMAL HIGH (ref 0–99)
NonHDL: 148.78
Total CHOL/HDL Ratio: 4
Triglycerides: 124 mg/dL (ref 0.0–149.0)
VLDL: 24.8 mg/dL (ref 0.0–40.0)

## 2022-05-07 LAB — HEPATIC FUNCTION PANEL
ALT: 25 U/L (ref 0–53)
AST: 21 U/L (ref 0–37)
Albumin: 4.1 g/dL (ref 3.5–5.2)
Alkaline Phosphatase: 64 U/L (ref 39–117)
Bilirubin, Direct: 0.1 mg/dL (ref 0.0–0.3)
Total Bilirubin: 0.6 mg/dL (ref 0.2–1.2)
Total Protein: 7.6 g/dL (ref 6.0–8.3)

## 2022-05-07 LAB — HEMOGLOBIN A1C: Hgb A1c MFr Bld: 5.1 % (ref 4.6–6.5)

## 2022-05-07 MED ORDER — ATORVASTATIN CALCIUM 80 MG PO TABS: 80.0000 mg | ORAL_TABLET | Freq: Every day | ORAL | 5 refills | Status: AC

## 2022-05-07 NOTE — Patient Instructions (Addendum)
Continue aspirin 81mg  daily and clopidogrel 75mg  daily.  Once you finish the 21 days of clopidogrel, continue just the aspirin 81mg  daily Start atorvastatin 80mg  daily to lower cholesterol.  Recheck lipid panel and liver function in 3 months (you can have that done with your other labs with Dr. Marisue Humble in June). Check CTA head 30 day cardiac event monitor.  If unremarkable, then consider seeing cardiology for implantable loop recorder Hgb A1c Mediterranean diet (see below) Routine exercise Follow up 5 months.   Mediterranean Diet A Mediterranean diet refers to food and lifestyle choices that are based on the traditions of countries located on the [employer]. It focuses on eating more fruits, vegetables, whole grains, beans, nuts, seeds, and heart-healthy fats, and eating less dairy, meat, eggs, and processed foods with added sugar, salt, and fat. This way of eating has been shown to help prevent certain conditions and improve outcomes for people who have chronic diseases, like kidney disease and heart disease. What are tips for following this plan? Reading food labels Check the serving size of packaged foods. For foods such as rice and pasta, the serving size refers to the amount of cooked product, not dry. Check the total fat in packaged foods. Avoid foods that have saturated fat or trans fats. Check the ingredient list for added sugars, such as corn syrup. Shopping  Buy a variety of foods that offer a balanced diet, including: Fresh fruits and vegetables (produce). Grains, beans, nuts, and seeds. Some of these may be available in unpackaged forms or large amounts (in bulk). Fresh seafood. Poultry and eggs. Low-fat dairy products. Buy whole ingredients instead of prepackaged foods. Buy fresh fruits and vegetables in-season from local farmers markets. Buy plain frozen fruits and vegetables. If you do not have access to quality fresh seafood, buy precooked frozen shrimp or canned  fish, such as tuna, salmon, or sardines. Stock your pantry so you always have certain foods on hand, such as olive oil, canned tuna, canned tomatoes, rice, pasta, and beans. Cooking Cook foods with extra-virgin olive oil instead of using butter or other vegetable oils. Have meat as a side dish, and have vegetables or grains as your main dish. This means having meat in small portions or adding small amounts of meat to foods like pasta or stew. Use beans or vegetables instead of meat in common dishes like chili or lasagna. Experiment with different cooking methods. Try roasting, broiling, steaming, and sauting vegetables. Add frozen vegetables to soups, stews, pasta, or rice. Add nuts or seeds for added healthy fats and plant protein at each meal. You can add these to yogurt, salads, or vegetable dishes. Marinate fish or vegetables using olive oil, lemon juice, garlic, and fresh herbs. Meal planning Plan to eat one vegetarian meal one day each week. Try to work up to two vegetarian meals, if possible. Eat seafood two or more times a week. Have healthy snacks readily available, such as: Vegetable sticks with hummus. Greek yogurt. Fruit and nut trail mix. Eat balanced meals throughout the week. This includes: Fruit: 2-3 servings a day. Vegetables: 4-5 servings a day. Low-fat dairy: 2 servings a day. Fish, poultry, or lean meat: 1 serving a day. Beans and legumes: 2 or more servings a week. Nuts and seeds: 1-2 servings a day. Whole grains: 6-8 servings a day. Extra-virgin olive oil: 3-4 servings a day. Limit red meat and sweets to only a few servings a month. Lifestyle  Cook and eat meals together with your family, when  possible. Drink enough fluid to keep your urine pale yellow. Be physically active every day. This includes: Aerobic exercise like running or swimming. Leisure activities like gardening, walking, or housework. Get 7-8 hours of sleep each night. If recommended by your  health care provider, drink red wine in moderation. This means 1 glass a day for nonpregnant women and 2 glasses a day for men. A glass of wine equals 5 oz (150 mL). What foods should I eat? Fruits Apples. Apricots. Avocado. Berries. Bananas. Cherries. Dates. Figs. Grapes. Lemons. Melon. Oranges. Peaches. Plums. Pomegranate. Vegetables Artichokes. Beets. Broccoli. Cabbage. Carrots. Eggplant. Green beans. Chard. Kale. Spinach. Onions. Leeks. Peas. Squash. Tomatoes. Peppers. Radishes. Grains Whole-grain pasta. Brown rice. Bulgur wheat. Polenta. Couscous. Whole-wheat bread. [place]. Meats and other proteins Beans. Almonds. Sunflower seeds. Pine nuts. Peanuts. [place]. Salmon. Scallops. Shrimp. [place]. Tilapia. Clams. Oysters. Eggs. Poultry without skin. Dairy Low-fat milk. Cheese. Greek yogurt. Fats and oils Extra-virgin olive oil. Avocado oil. Grapeseed oil. Beverages Water. Red wine. Herbal tea. Sweets and desserts Greek yogurt with honey. Baked apples. Poached pears. Trail mix. Seasonings and condiments Basil. Cilantro. Coriander. Cumin. Mint. Parsley. Sage. Rosemary. Tarragon. Garlic. Oregano. Thyme. Pepper. Balsamic vinegar. Tahini. Hummus. Tomato sauce. Olives. Mushrooms. The items listed above may not be a complete list of foods and beverages you can eat. Contact a dietitian for more information. What foods should I limit? This is a list of foods that should be eaten rarely or only on special occasions. Fruits Fruit canned in syrup. Vegetables Deep-fried potatoes (french fries). Grains Prepackaged pasta or rice dishes. Prepackaged cereal with added sugar. Prepackaged snacks with added sugar. Meats and other proteins Beef. Pork. Lamb. Poultry with skin. Hot dogs. [place] Salines. Dairy Ice cream. Sour cream. Whole milk. Fats and oils Butter. Canola oil. Vegetable oil. Beef fat (tallow). Lard. Beverages Juice. Sugar-sweetened soft drinks. Beer. Liquor and spirits. Sweets and  desserts Cookies. Cakes. Pies. Candy. Seasonings and condiments Mayonnaise. Pre-made sauces and marinades. The items listed above may not be a complete list of foods and beverages you should limit. Contact a dietitian for more information. Summary The Mediterranean diet includes both food and lifestyle choices. Eat a variety of fresh fruits and vegetables, beans, nuts, seeds, and whole grains. Limit the amount of red meat and sweets that you eat. If recommended by your health care provider, drink red wine in moderation. This means 1 glass a day for nonpregnant women and 2 glasses a day for men. A glass of wine equals 5 oz (150 mL). This information is not intended to replace advice given to you by your health care provider. Make sure you discuss any questions you have with your health care provider. Document Revised: 03/13/2019 Document Reviewed: 01/08/2019 Elsevier Patient Education  [place].

## 2022-05-08 ENCOUNTER — Telehealth: Payer: Self-pay | Admitting: Anesthesiology

## 2022-05-08 NOTE — Telephone Encounter (Signed)
Patient advised [facility] has order for Cardiac monitor.    PA Pending, Case# [id number], [phone].

## 2022-05-08 NOTE — Telephone Encounter (Signed)
Pt left message with AN stating he has questions about where to get the heart monitor he needs.

## 2022-05-09 ENCOUNTER — Other Ambulatory Visit: Payer: Self-pay

## 2022-05-09 DIAGNOSIS — E785 Hyperlipidemia, unspecified: Secondary | ICD-10-CM

## 2022-05-09 NOTE — Progress Notes (Signed)
Patient advised of lab results

## 2022-05-15 ENCOUNTER — Ambulatory Visit
Admission: RE | Admit: 2022-05-15 | Discharge: 2022-05-15 | Disposition: A | Discharge status: Home / Self Care | Payer: BC Managed Care – PPO | Source: Ambulatory Visit | Attending: Neurology | Admitting: Neurology

## 2022-05-15 DIAGNOSIS — I6613 Occlusion and stenosis of bilateral anterior cerebral arteries: Secondary | ICD-10-CM | POA: Diagnosis not present

## 2022-05-15 DIAGNOSIS — I6501 Occlusion and stenosis of right vertebral artery: Secondary | ICD-10-CM | POA: Diagnosis not present

## 2022-05-15 DIAGNOSIS — I63412 Cerebral infarction due to embolism of left middle cerebral artery: Secondary | ICD-10-CM

## 2022-05-15 MED ORDER — IOPAMIDOL (ISOVUE-370) INJECTION 76%
75.0000 mL | Freq: Once | INTRAVENOUS | Status: AC | PRN
  Administered 2022-05-15: 75 mL via INTRAVENOUS

## 2022-05-16 ENCOUNTER — Ambulatory Visit: Payer: BC Managed Care – PPO | Attending: Neurology

## 2022-05-16 DIAGNOSIS — I4891 Unspecified atrial fibrillation: Secondary | ICD-10-CM

## 2022-05-16 DIAGNOSIS — I63412 Cerebral infarction due to embolism of left middle cerebral artery: Secondary | ICD-10-CM | POA: Diagnosis not present

## 2022-05-21 ENCOUNTER — Encounter: Payer: Self-pay | Admitting: Neurology

## 2022-05-23 ENCOUNTER — Telehealth: Payer: Self-pay | Admitting: *Deleted

## 2022-05-23 ENCOUNTER — Other Ambulatory Visit: Payer: Self-pay

## 2022-05-23 ENCOUNTER — Other Ambulatory Visit: Payer: Self-pay | Admitting: Neurology

## 2022-05-23 DIAGNOSIS — I63412 Cerebral infarction due to embolism of left middle cerebral artery: Secondary | ICD-10-CM

## 2022-05-23 DIAGNOSIS — I4891 Unspecified atrial fibrillation: Secondary | ICD-10-CM

## 2022-05-23 NOTE — Telephone Encounter (Signed)
[employer] event monitor enrolled to mail 05/07/22. Monitor applied 05/16/22. Patient had reaction to monitor strips, sensitive skin set up with Bridge and 52M Red Dot Repositionable electrodes 2660-5 requested.

## 2022-05-23 NOTE — Progress Notes (Unsigned)
[employer] event monitor enrolled to mail 05/07/22. Monitor applied 05/16/22. Patient had reaction to monitor strips, sensitive skin set up with Bridge and 57M Red Dot Repositionable electrodes 2660-5 requested.  Dr. Radford Pax to read.

## 2022-05-31 ENCOUNTER — Telehealth: Payer: Self-pay | Admitting: Cardiology

## 2022-05-31 NOTE — Telephone Encounter (Signed)
Patient never received alternative electrodes requested from [employer]/ [employer] on 05/23/22. I will leave an assortment of electrodes for patient to try at our front desk sample cabinet. Patients wife, Cordelia Pen, will pick up tomorrow.

## 2022-05-31 NOTE — Telephone Encounter (Signed)
Pt would like a callback regarding monitor and it causing pt to break out and its been happening since day 1 he stated. He also stated its losing contact with skin. Please advise.

## 2022-06-20 NOTE — Progress Notes (Signed)
Patient advised  of results. Will speak to his wife and see if she could get the appt set up with his Cardiologist office an insurance to cover the [employer].

## 2022-07-12 DIAGNOSIS — Z8673 Personal history of transient ischemic attack (TIA), and cerebral infarction without residual deficits: Secondary | ICD-10-CM | POA: Diagnosis not present

## 2022-07-12 DIAGNOSIS — I10 Essential (primary) hypertension: Secondary | ICD-10-CM | POA: Diagnosis not present

## 2022-07-12 DIAGNOSIS — Z131 Encounter for screening for diabetes mellitus: Secondary | ICD-10-CM | POA: Diagnosis not present

## 2022-07-12 DIAGNOSIS — R7989 Other specified abnormal findings of blood chemistry: Secondary | ICD-10-CM | POA: Diagnosis not present

## 2022-07-12 DIAGNOSIS — R635 Abnormal weight gain: Secondary | ICD-10-CM | POA: Diagnosis not present

## 2022-07-12 DIAGNOSIS — Z125 Encounter for screening for malignant neoplasm of prostate: Secondary | ICD-10-CM | POA: Diagnosis not present

## 2022-07-12 DIAGNOSIS — E785 Hyperlipidemia, unspecified: Secondary | ICD-10-CM | POA: Diagnosis not present

## 2022-07-12 DIAGNOSIS — E559 Vitamin D deficiency, unspecified: Secondary | ICD-10-CM | POA: Diagnosis not present

## 2022-07-12 DIAGNOSIS — Z6841 Body Mass Index (BMI) 40.0 and over, adult: Secondary | ICD-10-CM | POA: Diagnosis not present

## 2022-07-25 DIAGNOSIS — E291 Testicular hypofunction: Secondary | ICD-10-CM | POA: Diagnosis not present

## 2022-07-25 DIAGNOSIS — Z1331 Encounter for screening for depression: Secondary | ICD-10-CM | POA: Diagnosis not present

## 2022-07-25 DIAGNOSIS — E785 Hyperlipidemia, unspecified: Secondary | ICD-10-CM | POA: Diagnosis not present

## 2022-07-25 DIAGNOSIS — I10 Essential (primary) hypertension: Secondary | ICD-10-CM | POA: Diagnosis not present

## 2022-08-08 DIAGNOSIS — M2550 Pain in unspecified joint: Secondary | ICD-10-CM | POA: Diagnosis not present

## 2022-08-08 DIAGNOSIS — Z6841 Body Mass Index (BMI) 40.0 and over, adult: Secondary | ICD-10-CM | POA: Diagnosis not present

## 2022-08-08 DIAGNOSIS — R5382 Chronic fatigue, unspecified: Secondary | ICD-10-CM | POA: Diagnosis not present

## 2022-08-08 DIAGNOSIS — E291 Testicular hypofunction: Secondary | ICD-10-CM | POA: Diagnosis not present

## 2022-08-21 DIAGNOSIS — Z6841 Body Mass Index (BMI) 40.0 and over, adult: Secondary | ICD-10-CM | POA: Diagnosis not present

## 2022-08-21 DIAGNOSIS — E559 Vitamin D deficiency, unspecified: Secondary | ICD-10-CM | POA: Diagnosis not present

## 2022-09-05 DIAGNOSIS — E559 Vitamin D deficiency, unspecified: Secondary | ICD-10-CM | POA: Diagnosis not present

## 2022-09-05 DIAGNOSIS — R7989 Other specified abnormal findings of blood chemistry: Secondary | ICD-10-CM | POA: Diagnosis not present

## 2022-09-05 DIAGNOSIS — Z6841 Body Mass Index (BMI) 40.0 and over, adult: Secondary | ICD-10-CM | POA: Diagnosis not present

## 2022-09-20 DIAGNOSIS — Z6841 Body Mass Index (BMI) 40.0 and over, adult: Secondary | ICD-10-CM | POA: Diagnosis not present

## 2022-09-20 DIAGNOSIS — I10 Essential (primary) hypertension: Secondary | ICD-10-CM | POA: Diagnosis not present

## 2022-10-01 DIAGNOSIS — Z6841 Body Mass Index (BMI) 40.0 and over, adult: Secondary | ICD-10-CM | POA: Diagnosis not present

## 2022-10-01 DIAGNOSIS — G4733 Obstructive sleep apnea (adult) (pediatric): Secondary | ICD-10-CM | POA: Diagnosis not present

## 2022-10-01 DIAGNOSIS — I10 Essential (primary) hypertension: Secondary | ICD-10-CM | POA: Diagnosis not present

## 2022-10-01 DIAGNOSIS — J309 Allergic rhinitis, unspecified: Secondary | ICD-10-CM | POA: Diagnosis not present

## 2022-10-01 DIAGNOSIS — E7800 Pure hypercholesterolemia, unspecified: Secondary | ICD-10-CM | POA: Diagnosis not present

## 2022-10-09 NOTE — Progress Notes (Unsigned)
NEUROLOGY FOLLOW UP OFFICE NOTE  Kyle Manning [id number]  Assessment/Plan:   Left ischemic cortical infarct within the precentral gyrus, likely embolic of unknown source. Hypertension Hyperlipidemia Obstructive sleep apnea Probable bilateral carpal tunnel syndrome, mild     Secondary stroke prevention: ASA 81mg  daily monotherapy Atorvastatin 80mg  daily.  Repeat lipid panel (with LFTs)LDL goal less than 70 Normotensive blood pressure Hgb A1c goal less than 7 Mediterranean diet CPAP at night Routine exercise If numbness in hands worsen, try wrist splints, otherwise consider NCV-EMG Follow up 6 months.   Subjective:  Kyle Manning is a 52 year old right-handed male with hypertension, HLD and OSA who follows up for stroke.  CTA personally reviewed.  UPDATE: Current medications:  ASA 81mg , lisinopril, bisoprolol-hydrochlorothiazide, atorvastatin 80mg   05/07/2022 LABS:  T Chol 301, TG 134, HDL 52, LDL 124; Hgb A1c 5.1, hepatic panel normal.  Started atorvastatin. 05/15/2022 CTA HEAD & NECK:  1. No acute intracranial process. 2. No intracranial large vessel occlusion. Moderate stenosis in a left A3 branch and a distal right ACA branch. 3. Moderate stenosis in the distal right V4. Underwent stroke workup: 03-05/2022 30 DAY CARDIAC EVENT MONITOR:  NSR.  No paroxymal atrial fibrillation or significant arrhythmia. 10/01/2022 REPEAT LIPID PANEL:  t chol 98, TG 95, HDL 29, LDL 51; CMP WNL  Doing well.  Started dieting.  Lost some weight.  Going to start routine exercise on stationary bike soon.    For several years, he reports transient numbness and tingling in both hands, usually left hand.  May be all fingers but sometimes first 3 digits.  Notices it when sitting and watching TV.  Shakes hands out and it resolves.   HISTORY: On 04/25/2022, he woke up that morning and noticed that the index finger and thumb of his right hand felt funny. He had trouble buttoning up  his shirt.  When he got to work a little later, he noted difficulty typing and using the mouse at his computer.  On a couple of occasions, he briefly had speech difficulty, each lasting several seconds.  One time, he had trouble getting words out.  Another time, he interchanged words (such as saying "chair red" instead of "red chair").  Symptoms lasted about 2 hours.  Later, he had trouble again with his hand, difficulty straightening his right index finger.  This lasted about 15 minutes.   He saw his PCP who suspected TIA and started him on an ASA.  MRI of brain with and without contrast on 3/7 showed small focus of faint diffusion restriction within the left precentral gyrus lateral to the hand motor knob concerning for small acute to early subacute ischemic infarct.  2D echo with bubble study on 3/14 showed LVEF 59% with negative bubble study.  Bilateral carotid ultrasound on 3/15 showed no hemodynamically significant stenosis.  He had recently had routine labs checked by his PCP, including a lipid panel that revealed total chol 196, TG 132 and LDL 126.  Sometimes his right hand may become numb with use, such as while typing, but that is nothing new (he had that for years) and he will just shake it out.  No recurrence of speech difficulty.  Denies palpitations.       PAST MEDICAL HISTORY: Past Medical History:  Diagnosis Date   HSV (herpes simplex virus) anogenital infection    Hypertension    Kidney stone    OSA (obstructive sleep apnea)    Pollen allergy  MEDICATIONS: Current Outpatient Medications on File Prior to Visit  Medication Sig Dispense Refill   atorvastatin (LIPITOR) 80 MG tablet Take 1 tablet (80 mg total) by mouth daily. 30 tablet 5   bisoprolol-hydrochlorothiazide (ZIAC) 5-6.25 MG per tablet Take 1 tablet by mouth daily.     cetirizine (ZYRTEC) 10 MG tablet Take 10 mg by mouth daily.     fluticasone (FLONASE) 50 MCG/ACT nasal spray Place into both nostrils daily.      ibuprofen (ADVIL,MOTRIN) 200 MG tablet Take 400 mg by mouth daily.     lisinopril (ZESTRIL) 10 MG tablet Take 10 mg by mouth daily.     Multiple Vitamin (MULTIVITAMIN) capsule Take 1 capsule by mouth daily.     No current facility-administered medications on file prior to visit.    ALLERGIES: No Known Allergies  FAMILY HISTORY: Family History  Problem Relation Age of Onset   Hyperlipidemia Mother    Glaucoma Mother    Stroke Father    Prostate cancer Father    Diabetes Father       Objective:  *** General: No acute distress.  Patient appears ***-groomed.   Head:  Normocephalic/atraumatic Eyes:  Fundi examined but not visualized Neck: supple, no paraspinal tenderness, full range of motion Heart:  Regular rate and rhythm Lungs:  Clear to auscultation bilaterally Back: No paraspinal tenderness Neurological Exam: alert and oriented.  Speech fluent and not dysarthric, language intact.  CN II-XII intact. Bulk and tone normal, muscle strength 5/5 throughout.  Sensation to light touch intact.  Deep tendon reflexes 2+ throughout, toes downgoing.  Finger to nose testing intact.  Gait normal, Romberg negative.   Shon Millet, DO  CC: ***

## 2022-10-10 ENCOUNTER — Encounter: Payer: Self-pay | Admitting: Neurology

## 2022-10-10 ENCOUNTER — Ambulatory Visit: Payer: BC Managed Care – PPO | Admitting: Neurology

## 2022-10-10 VITALS — BP 124/77 | HR 63 | Ht 70.0 in | Wt 297.0 lb

## 2022-10-10 DIAGNOSIS — G4733 Obstructive sleep apnea (adult) (pediatric): Secondary | ICD-10-CM | POA: Diagnosis not present

## 2022-10-10 DIAGNOSIS — E785 Hyperlipidemia, unspecified: Secondary | ICD-10-CM

## 2022-10-10 DIAGNOSIS — I10 Essential (primary) hypertension: Secondary | ICD-10-CM | POA: Diagnosis not present

## 2022-10-10 DIAGNOSIS — E291 Testicular hypofunction: Secondary | ICD-10-CM | POA: Diagnosis not present

## 2022-10-10 DIAGNOSIS — I63412 Cerebral infarction due to embolism of left middle cerebral artery: Secondary | ICD-10-CM

## 2022-10-10 DIAGNOSIS — G5603 Carpal tunnel syndrome, bilateral upper limbs: Secondary | ICD-10-CM

## 2022-10-10 NOTE — Patient Instructions (Signed)
Continue aspirin 81mg  daily Continue atorvastatin 80mg  daily Continue blood pressure medication Mediterranean diet (see below) Routine exercise Use CPAP Follow up 6 months.

## 2022-10-12 DIAGNOSIS — Z6841 Body Mass Index (BMI) 40.0 and over, adult: Secondary | ICD-10-CM | POA: Diagnosis not present

## 2022-10-12 DIAGNOSIS — M2550 Pain in unspecified joint: Secondary | ICD-10-CM | POA: Diagnosis not present

## 2022-10-12 DIAGNOSIS — E291 Testicular hypofunction: Secondary | ICD-10-CM | POA: Diagnosis not present

## 2022-10-12 DIAGNOSIS — R5382 Chronic fatigue, unspecified: Secondary | ICD-10-CM | POA: Diagnosis not present

## 2022-10-16 ENCOUNTER — Ambulatory Visit: Payer: BC Managed Care – PPO | Admitting: Neurology

## 2022-10-18 DIAGNOSIS — Z6841 Body Mass Index (BMI) 40.0 and over, adult: Secondary | ICD-10-CM | POA: Diagnosis not present

## 2022-10-18 DIAGNOSIS — I10 Essential (primary) hypertension: Secondary | ICD-10-CM | POA: Diagnosis not present

## 2022-11-08 ENCOUNTER — Telehealth: Payer: Self-pay | Admitting: *Deleted

## 2022-11-08 DIAGNOSIS — G4733 Obstructive sleep apnea (adult) (pediatric): Secondary | ICD-10-CM

## 2022-11-08 NOTE — Telephone Encounter (Addendum)
Patient called in today needing a Rx for bipap supplies. He has no had an office visit since 3/22. He needs an appt with he sleep provider.  Upon patient request DME selection is [place]. Patient understands he will be contacted by [facility] to set up his cpap. Patient understands to call if [facility] does not contact him with new setup in a timely manner. Patient understands they will be called once confirmation has been received from [facility] that they have received their new machine to schedule 10 week follow up appointment.   [facility] notified of new cpap order  Please add to airview Patient was grateful for the call and thanked me.

## 2022-11-13 DIAGNOSIS — E559 Vitamin D deficiency, unspecified: Secondary | ICD-10-CM | POA: Diagnosis not present

## 2022-11-13 DIAGNOSIS — E291 Testicular hypofunction: Secondary | ICD-10-CM | POA: Diagnosis not present

## 2022-11-13 DIAGNOSIS — I10 Essential (primary) hypertension: Secondary | ICD-10-CM | POA: Diagnosis not present

## 2022-12-26 DIAGNOSIS — Z6841 Body Mass Index (BMI) 40.0 and over, adult: Secondary | ICD-10-CM | POA: Diagnosis not present

## 2022-12-26 DIAGNOSIS — I10 Essential (primary) hypertension: Secondary | ICD-10-CM | POA: Diagnosis not present

## 2022-12-26 DIAGNOSIS — E559 Vitamin D deficiency, unspecified: Secondary | ICD-10-CM | POA: Diagnosis not present

## 2023-01-09 DIAGNOSIS — E559 Vitamin D deficiency, unspecified: Secondary | ICD-10-CM | POA: Diagnosis not present

## 2023-01-09 DIAGNOSIS — Z6841 Body Mass Index (BMI) 40.0 and over, adult: Secondary | ICD-10-CM | POA: Diagnosis not present

## 2023-01-10 ENCOUNTER — Ambulatory Visit: Payer: BC Managed Care – PPO | Attending: Cardiology | Admitting: Cardiology

## 2023-01-10 ENCOUNTER — Telehealth: Payer: Self-pay | Admitting: *Deleted

## 2023-01-10 ENCOUNTER — Encounter: Payer: Self-pay | Admitting: Cardiology

## 2023-01-10 VITALS — BP 106/70 | HR 87 | Ht 70.0 in | Wt 277.0 lb

## 2023-01-10 DIAGNOSIS — G4733 Obstructive sleep apnea (adult) (pediatric): Secondary | ICD-10-CM

## 2023-01-10 DIAGNOSIS — I10 Essential (primary) hypertension: Secondary | ICD-10-CM | POA: Diagnosis not present

## 2023-01-10 DIAGNOSIS — E291 Testicular hypofunction: Secondary | ICD-10-CM | POA: Diagnosis not present

## 2023-01-10 NOTE — Telephone Encounter (Addendum)
Patient would like to switch from [facility] to [facility] for his DME. He also needs a new mask which I will order for him through [facility]. [facility] will contact the patient.  order Respironics dreamwear large full face mask and PAP supplies   Order placed to [facility] via community message

## 2023-01-10 NOTE — Progress Notes (Signed)
Sleep Medicine  Note    Date:  01/10/2023   ID:  Kyle Manning, DOB 05-May-1970, MRN [id number]  PCP:  Blair Heys, MD (Inactive)  Cardiologist:  Armanda Magic, MD   Chief Complaint  Patient presents with   Sleep Apnea   Hypertension    History of Present Illness:  Kyle Manning is a 52 y.o. male with a hx of HTN and OSA.  He was dx with OSA at least 10 years ago and has been on CPAP.  His machine is old and he is now having problems with his device. He has a Respironics device which has been recalled.  He says that his device was not working and would shut off in the middle of the night.  When I last saw him he had not been using his device.  I ordered a split-night sleep study which revealed severe obstructive sleep apnea with an AHI of 84.7/h with no central events.  Unfortunately could not be adequately titrated on CPAP and underwent BiPAP titration and ultimately was started on auto BiPAP with an IPAP max of 20 cm H2O, EPAP min 5 cm H2O and pressure support 4 cm H2O.  The patient then called in needing repeat BiPAP supplies but had not been seen in the office for 2 years.  He is now here for follow-up.  He is doing well with his PAP device and thinks that he has gotten used to it.  He tolerates the Respironics dreamwear large full face mask and feels the pressure is adequate.  Since going on PAP he feels rested in the am and has no significant daytime sleepiness.  He denies any significant mouth or nasal dryness or nasal congestion.  He does not think that he snores.    Past Medical History:  Diagnosis Date   HSV (herpes simplex virus) anogenital infection    Hypertension    Kidney stone    OSA treated with BiPAP    severe obstructive sleep apnea with an AHI of 84.7/h on auto BiPAP   Pollen allergy     Past Surgical History:  Procedure Laterality Date   APPENDECTOMY     LUMBAR DISC SURGERY     ROTATOR CUFF REPAIR Bilateral     Current  Medications: Current Meds  Medication Sig   aspirin EC 81 MG tablet Take 81 mg by mouth daily.   atorvastatin (LIPITOR) 80 MG tablet Take 1 tablet (80 mg total) by mouth daily.   B Complex Vitamins (VITAMIN B COMPLEX) CAPS as directed Orally   bisoprolol-hydrochlorothiazide (ZIAC) 5-6.25 MG per tablet Take 1 tablet by mouth daily.   Cholecalciferol (VITAMIN D3) 1000 units CAPS 1 capsule Orally Once a day   CLOMID 50 MG tablet Take 50 mg by mouth daily.   fluticasone (FLONASE) 50 MCG/ACT nasal spray Place into both nostrils daily.   ibuprofen (ADVIL,MOTRIN) 200 MG tablet Take 400 mg by mouth daily.   lisinopril (ZESTRIL) 10 MG tablet Take 10 mg by mouth daily.   Misc Natural Products (DIM-PLUS) CAPS as directed Orally   Multiple Vitamin (MULTIVITAMIN) capsule Take 1 capsule by mouth daily.   ZEPBOUND 2.5 MG/0.5ML Pen Inject 2.5 mg into the skin once a week.    Allergies:   Patient has no known allergies.   Social History   Socioeconomic History   Marital status: Married    Spouse name: Not on file   Number of children: Not on file   Years of education: Not  on file   Highest education level: Not on file  Occupational History   Not on file  Tobacco Use   Smoking status: Former    Current packs/day: 0.00    Types: Cigarettes    Quit date: 06/28/2010    Years since quitting: 12.5   Smokeless tobacco: Former  Substance and Sexual Activity   Alcohol use: Yes    Comment: social   Drug use: Never   Sexual activity: Not on file  Other Topics Concern   Not on file  Social History Narrative   Are you right handed or left handed? Right   Are you currently employed ?  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 no   Who lives with you? Wife   What type of home do you live in: 1 story or 2 story? 1       Social Determinants of Corporate investment banker Strain: Not on file  Food Insecurity: Not on file  Transportation Needs: Not on file  Physical  Activity: Not on file  Stress: Not on file  Social Connections: Not on file     Family History:  The patient's family history includes Diabetes in his father; Glaucoma in his mother; Hyperlipidemia in his mother; Prostate cancer in his father; Stroke in his father.   ROS:   Please see the history of present illness.    ROS All other systems reviewed and are negative.      No data to display             PHYSICAL EXAM:   VS:  BP 106/70   Pulse 87   Ht 5\' 10"  (1.778 m)   Wt 277 lb (125.6 kg)   SpO2 96%   BMI 39.75 kg/m    GEN: Well nourished, well developed in no acute distress HEENT: Normal NECK: No JVD; No carotid bruits LYMPHATICS: No lymphadenopathy CARDIAC:RRR, no murmurs, rubs, gallops RESPIRATORY:  Clear to auscultation without rales, wheezing or rhonchi  ABDOMEN: Soft, non-tender, non-distended MUSCULOSKELETAL:  No edema; No deformity  SKIN: Warm and dry NEUROLOGIC:  Alert and oriented x 3 PSYCHIATRIC:  Normal affect  Wt Readings from Last 3 Encounters:  01/10/23 277 lb (125.6 kg)  10/11/22 297 lb (134.7 kg)  05/07/22 (!) 317 lb 12.8 oz (144.2 kg)      Studies/Labs Reviewed:  EKG Interpretation Date/Time:  Thursday January 10 2023 08:43:56 EST Ventricular Rate:  77 PR Interval:  164 QRS Duration:  90 QT Interval:  384 QTC Calculation: 434 R Axis:   29  Text Interpretation: Normal sinus rhythm Normal ECG No previous ECGs available Confirmed by Armanda Magic ([id number]) on 01/10/2023 9:09:33 AM    Recent Labs: 05/07/2022: ALT 25   Lipid Panel    Component Value Date/Time   CHOL 201 (H) 05/07/2022 1342   TRIG 124.0 05/07/2022 1342   HDL 52.00 05/07/2022 1342   CHOLHDL 4 05/07/2022 1342   VLDL 24.8 05/07/2022 1342   LDLCALC 124 (H) 05/07/2022 1342     Additional studies/ records that were reviewed today include:  OV notes from PCP    ASSESSMENT:    1. OSA treated with BiPAP   2. Primary hypertension       PLAN:  In order of problems  listed above:  OSA - The patient is tolerating PAP therapy well without any problems. The PAP download performed by his DME was personally reviewed and interpreted  by me today and showed an AHI of 2.7/hr on auto BiPAP with 88% compliance in using more than 4 hours nightly.  The patient has been using and benefiting from PAP use and will continue to benefit from therapy.    HTN -BP is controlled today on exam -Continue prescription drug management with bisoprolol HCT 5-6.25 mg daily and lisinopril 10 mg daily with as needed refills   Medication Adjustments/Labs and Tests Ordered: Current medicines are reviewed at length with the patient today.  Concerns regarding medicines are outlined above.  Medication changes, Labs and Tests ordered today are listed in the Patient Instructions below.  There are no Patient Instructions on file for this visit.   Signed, Armanda Magic, MD  01/10/2023 8:58 AM    [facility] [address] [place], [place], [place]  [address] Phone: [phone]; Fax: [phone]

## 2023-01-10 NOTE — Addendum Note (Signed)
Addended by: Reesa Chew on: 01/10/2023 11:45 AM   Modules accepted: Orders

## 2023-01-10 NOTE — Patient Instructions (Signed)
Medication Instructions:  Your physician recommends that you continue on your current medications as directed. Please refer to the Current Medication list given to you today.  *If you need a refill on your cardiac medications before your next appointment, please call your pharmacy*  Follow-Up: At [facility], you and your health needs are our priority.  As part of our continuing mission to provide you with exceptional heart care, we have created designated Provider Care Teams.  These Care Teams include your primary Cardiologist (physician) and Advanced Practice Providers (APPs -  Physician Assistants and Nurse Practitioners) who all work together to provide you with the care you need, when you need it.  Your next appointment:   1 year  Provider:   Dr. Armanda Magic

## 2023-01-19 ENCOUNTER — Encounter (HOSPITAL_BASED_OUTPATIENT_CLINIC_OR_DEPARTMENT_OTHER): Payer: Self-pay | Admitting: Emergency Medicine

## 2023-01-19 ENCOUNTER — Emergency Department (HOSPITAL_BASED_OUTPATIENT_CLINIC_OR_DEPARTMENT_OTHER)
Admission: EM | Admit: 2023-01-19 | Discharge: 2023-01-19 | Disposition: A | Discharge status: Home / Self Care | Payer: BC Managed Care – PPO | Attending: Emergency Medicine | Admitting: Emergency Medicine

## 2023-01-19 ENCOUNTER — Other Ambulatory Visit: Payer: Self-pay

## 2023-01-19 ENCOUNTER — Emergency Department (HOSPITAL_BASED_OUTPATIENT_CLINIC_OR_DEPARTMENT_OTHER): Payer: BC Managed Care – PPO

## 2023-01-19 DIAGNOSIS — D72829 Elevated white blood cell count, unspecified: Secondary | ICD-10-CM | POA: Diagnosis not present

## 2023-01-19 DIAGNOSIS — R1012 Left upper quadrant pain: Secondary | ICD-10-CM | POA: Diagnosis not present

## 2023-01-19 DIAGNOSIS — Z7982 Long term (current) use of aspirin: Secondary | ICD-10-CM | POA: Diagnosis not present

## 2023-01-19 DIAGNOSIS — R1013 Epigastric pain: Secondary | ICD-10-CM | POA: Insufficient documentation

## 2023-01-19 DIAGNOSIS — K5730 Diverticulosis of large intestine without perforation or abscess without bleeding: Secondary | ICD-10-CM | POA: Diagnosis not present

## 2023-01-19 DIAGNOSIS — R112 Nausea with vomiting, unspecified: Secondary | ICD-10-CM | POA: Insufficient documentation

## 2023-01-19 HISTORY — DX: Gastro-esophageal reflux disease without esophagitis: K21.9

## 2023-01-19 HISTORY — DX: Cerebral infarction, unspecified: I63.9

## 2023-01-19 LAB — I-STAT CHEM 8, ED
BUN: 18 mg/dL (ref 6–20)
Calcium, Ion: 1.17 mmol/L (ref 1.15–1.40)
Chloride: 104 mmol/L (ref 98–111)
Creatinine, Ser: 0.9 mg/dL (ref 0.61–1.24)
Glucose, Bld: 91 mg/dL (ref 70–99)
HCT: 43 % (ref 39.0–52.0)
Hemoglobin: 14.6 g/dL (ref 13.0–17.0)
Potassium: 4.3 mmol/L (ref 3.5–5.1)
Sodium: 138 mmol/L (ref 135–145)
TCO2: 22 mmol/L (ref 22–32)

## 2023-01-19 LAB — HEPATIC FUNCTION PANEL
ALT: 20 U/L (ref 0–44)
AST: 21 U/L (ref 15–41)
Albumin: 3.4 g/dL — ABNORMAL LOW (ref 3.5–5.0)
Alkaline Phosphatase: 47 U/L (ref 38–126)
Bilirubin, Direct: 0.2 mg/dL (ref 0.0–0.2)
Indirect Bilirubin: 0.5 mg/dL (ref 0.3–0.9)
Total Bilirubin: 0.7 mg/dL (ref <1.2)
Total Protein: 6.4 g/dL — ABNORMAL LOW (ref 6.5–8.1)

## 2023-01-19 LAB — CBC WITH DIFFERENTIAL/PLATELET
Abs Immature Granulocytes: 0.07 10*3/uL (ref 0.00–0.07)
Basophils Absolute: 0 10*3/uL (ref 0.0–0.1)
Basophils Relative: 0 %
Eosinophils Absolute: 1.7 10*3/uL — ABNORMAL HIGH (ref 0.0–0.5)
Eosinophils Relative: 13 %
HCT: 42.6 % (ref 39.0–52.0)
Hemoglobin: 14.6 g/dL (ref 13.0–17.0)
Immature Granulocytes: 1 %
Lymphocytes Relative: 25 %
Lymphs Abs: 3.3 10*3/uL (ref 0.7–4.0)
MCH: 31.8 pg (ref 26.0–34.0)
MCHC: 34.3 g/dL (ref 30.0–36.0)
MCV: 92.8 fL (ref 80.0–100.0)
Monocytes Absolute: 0.8 10*3/uL (ref 0.1–1.0)
Monocytes Relative: 6 %
Neutro Abs: 7.1 10*3/uL (ref 1.7–7.7)
Neutrophils Relative %: 55 %
Platelets: 250 10*3/uL (ref 150–400)
RBC: 4.59 MIL/uL (ref 4.22–5.81)
RDW: 12.6 % (ref 11.5–15.5)
WBC: 12.9 10*3/uL — ABNORMAL HIGH (ref 4.0–10.5)
nRBC: 0 % (ref 0.0–0.2)

## 2023-01-19 LAB — LIPASE, BLOOD: Lipase: 30 U/L (ref 11–51)

## 2023-01-19 MED ORDER — ONDANSETRON HCL 4 MG/2ML IJ SOLN
4.0000 mg | Freq: Once | INTRAMUSCULAR | Status: AC
  Administered 2023-01-19: 4 mg via INTRAVENOUS
  Filled 2023-01-19: qty 2

## 2023-01-19 MED ORDER — ALUM & MAG HYDROXIDE-SIMETH 200-200-20 MG/5ML PO SUSP
30.0000 mL | Freq: Once | ORAL | Status: AC
  Administered 2023-01-19: 30 mL via ORAL
  Filled 2023-01-19: qty 30

## 2023-01-19 MED ORDER — PROCHLORPERAZINE MALEATE 10 MG PO TABS: 10.0000 mg | ORAL_TABLET | Freq: Two times a day (BID) | ORAL | 0 refills | Status: AC | PRN

## 2023-01-19 MED ORDER — IOHEXOL 300 MG/ML  SOLN
100.0000 mL | Freq: Once | INTRAMUSCULAR | Status: AC | PRN
Start: 2023-01-19 — End: 2023-01-19
  Administered 2023-01-19: 100 mL via INTRAVENOUS

## 2023-01-19 MED ORDER — SODIUM CHLORIDE 0.9 % IV BOLUS
1000.0000 mL | Freq: Once | INTRAVENOUS | Status: AC
  Administered 2023-01-19: 1000 mL via INTRAVENOUS

## 2023-01-19 MED ORDER — DICYCLOMINE HCL 20 MG PO TABS: 20.0000 mg | ORAL_TABLET | Freq: Two times a day (BID) | ORAL | 0 refills | Status: AC

## 2023-01-19 MED ORDER — FENTANYL CITRATE PF 50 MCG/ML IJ SOSY
50.0000 ug | PREFILLED_SYRINGE | Freq: Once | INTRAMUSCULAR | Status: AC
  Administered 2023-01-19: 50 ug via INTRAVENOUS
  Filled 2023-01-19: qty 1

## 2023-01-19 MED ORDER — FAMOTIDINE 20 MG PO TABS
20.0000 mg | ORAL_TABLET | Freq: Once | ORAL | Status: AC
  Administered 2023-01-19: 20 mg via ORAL
  Filled 2023-01-19: qty 1

## 2023-01-19 MED ORDER — LIDOCAINE VISCOUS HCL 2 % MT SOLN
15.0000 mL | Freq: Once | OROMUCOSAL | Status: AC
  Administered 2023-01-19: 15 mL via ORAL
  Filled 2023-01-19: qty 15

## 2023-01-19 NOTE — Discharge Instructions (Signed)
Contact a health care provider if: Your symptoms get worse. You have new symptoms. You have a fever. You cannot drink fluids without vomiting. Your nausea does not go away after 2 days. You feel light-headed or dizzy. You have a headache. You have muscle cramps. You have a rash. You have pain while urinating. Get help right away if: You have pain in your chest, neck, arm, or jaw. You feel extremely weak or you faint. You have persistent vomiting. You have vomit that is bright red or looks like black coffee grounds. You have bloody or black stools (feces) or stools that look like tar. You have a severe headache, a stiff neck, or both. You have severe pain, cramping, or bloating in your abdomen. You have difficulty breathing, or you are breathing very quickly. Your heart is beating very quickly. Your skin feels cold and clammy. You feel confused. You have signs of dehydration, such as: Dark urine, very little urine, or no urine. Cracked lips. Dry mouth. Sunken eyes. Sleepiness. Weakness. These symptoms may be an emergency. Get help right away. Call 911. Do not wait to see if the symptoms will go away. Do not drive yourself to the hospital.

## 2023-01-19 NOTE — ED Triage Notes (Signed)
States last Sat and Sunday had a few episodes of n/v after eating and then resolved. After eating on Thursday began having n/v and LUQ pain . Taking Zofran without relief

## 2023-01-19 NOTE — ED Notes (Signed)
Pt advised Wednesday, sudden onset of abd pain. Denied trauma or recent illness. LUQ tenderness and sporadic nausea. No constipation or bloody stools. No diarrhea at present. Ambulatory without assist. Is taking weight loss med with 45lb loss.

## 2023-01-19 NOTE — ED Provider Notes (Signed)
[facility] Provider Note   CSN: [id number] Arrival date & time: 01/19/23  9528     History  Chief Complaint  Patient presents with   Abdominal Pain    NAME Kyle Manning is a 52 y.o. male who presents with a cc of LQU abdominal pain. Onset 9 days ago after eating stew. He has LUQ and epigastric pain described as aching and burning.  He has associated belching and reflux symptoms.  He has a history of reflux and usually takes famotidine 20 mg at night before bed.  He denies a history of hypertriglyceridemia but it did have a TIA and is on Lipitor.  Patient states that he does not drink alcohol very regularly but did have 1 beer on this past Sunday when he was feeling better.  Does take Zepbound but states he has been on the same dose for the past 4 weeks.  He denies any recent travel, ingestion of suspicious foods or contacts with similar symptoms.  He has loose stools.   Abdominal Pain Pain location:  LUQ Pain quality: aching, burning and fullness   Pain radiates to:  LLQ Pain severity:  Severe Onset quality:  Gradual Duration:  9 days Progression:  Waxing and waning Context: previous surgery (Post appendectomy 52 y/o)   Context: not alcohol use, not awakening from sleep, not diet changes, not eating, not laxative use, not medication withdrawal, not recent travel and not suspicious food intake   Relieved by:  Nothing Worsened by:  Nothing Ineffective treatments:  OTC medications, vomiting, antacids, belching, acetaminophen, flatus, heat and not moving Associated symptoms: anorexia, belching, diarrhea and vomiting   Associated symptoms: no shortness of breath   Risk factors: obesity   Risk factors: no alcohol abuse and no recent hospitalization        Home Medications Prior to Admission medications   Medication Sig Start Date End Date Taking? Authorizing Provider  aspirin EC 81 MG tablet Take 81 mg by mouth daily.     [provider]  atorvastatin (LIPITOR) 80 MG tablet Take 1 tablet (80 mg total) by mouth daily. 05/07/22   Drema Dallas, DO  B Complex Vitamins (VITAMIN B COMPLEX) CAPS as directed Orally    [provider]  bisoprolol-hydrochlorothiazide (ZIAC) 5-6.25 MG per tablet Take 1 tablet by mouth daily.    [provider]  Cholecalciferol (VITAMIN D3) 1000 units CAPS 1 capsule Orally Once a day    [provider]  CLOMID 50 MG tablet Take 50 mg by mouth daily. 07/21/22   [provider]  fluticasone (FLONASE) 50 MCG/ACT nasal spray Place into both nostrils daily.    [provider]  ibuprofen (ADVIL,MOTRIN) 200 MG tablet Take 400 mg by mouth daily.    [provider]  lisinopril (ZESTRIL) 10 MG tablet Take 10 mg by mouth daily. 10/28/19   [provider]  Misc Natural Products (DIM-PLUS) CAPS as directed Orally    [provider]  Multiple Vitamin (MULTIVITAMIN) capsule Take 1 capsule by mouth daily.    [provider]  ZEPBOUND 2.5 MG/0.5ML Pen Inject 2.5 mg into the skin once a week. 09/05/22   [provider]      Allergies    Patient has no known allergies.    Review of Systems   Review of Systems  Respiratory:  Negative for shortness of breath.   Gastrointestinal:  Positive for abdominal pain, anorexia, diarrhea and vomiting.  Physical Exam Updated Vital Signs BP (!) 107/59 (BP Location: Right Arm)   Pulse 76   Temp 97.9 F (36.6 C) (Oral)   Resp 20   SpO2 96%  Physical Exam Vitals and nursing note reviewed.  Constitutional:      General: He is not in acute distress.    Appearance: He is well-developed. He is not diaphoretic.  HENT:     Head: Normocephalic and atraumatic.  Eyes:     General: No scleral icterus.    Conjunctiva/sclera: Conjunctivae normal.  Cardiovascular:     Rate and Rhythm: Normal rate and regular rhythm.     Heart sounds: Normal heart sounds.  Pulmonary:      Effort: Pulmonary effort is normal. No respiratory distress.     Breath sounds: Normal breath sounds.  Abdominal:     Palpations: Abdomen is soft.     Tenderness: There is abdominal tenderness in the epigastric area and left upper quadrant.    Musculoskeletal:     Cervical back: Normal range of motion and neck supple.  Skin:    General: Skin is warm and dry.  Neurological:     Mental Status: He is alert.  Psychiatric:        Behavior: Behavior normal.     ED Results / Procedures / Treatments   Labs (all labs ordered are listed, but only abnormal results are displayed) Labs Reviewed  CBC WITH DIFFERENTIAL/PLATELET  LIPASE, BLOOD  HEPATIC FUNCTION PANEL  I-STAT CHEM 8, ED    EKG None  Radiology No results found.  Procedures Procedures    Medications Ordered in ED Medications  sodium chloride 0.9 % bolus 1,000 mL (has no administration in time range)  fentaNYL (SUBLIMAZE) injection 50 mcg (has no administration in time range)  ondansetron (ZOFRAN) injection 4 mg (has no administration in time range)    ED Course/ Medical Decision Making/ A&P Clinical Course as of 01/19/23 1052  Sat Jan 19, 2023  1022 WBC(!): 12.9 [AH]  1022 Lipase: 30 [AH]  1049 CT ABDOMEN PELVIS W CONTRAST I personally visualized and interpreted the images using our PACS system. Acute findings include:  None   [AH]    Clinical Course User Index [AH] Arthor Captain, PA-C                                 Medical Decision Making This patient presents to the ED for concern of LUQ abd pain, n/v/d, this involves an extensive number of treatment options, and is a complaint that carries with it a high risk of complications and morbidity.  The differential diagnosis for generalized abdominal pain includes, but is not limited to AAA, gastroenteritis,gastritis, Bowel obstruction, Bowel perforation. Gastroparesis, DKA, Hernia, Inflammatory bowel disease, mesenteric ischemia, pancreatitis, peritonitis  SBP, volvulus.    Co morbidities:       has a past medical history of GERD (gastroesophageal reflux disease), HSV (herpes simplex virus) anogenital infection, Hypertension, Kidney stone, OSA treated with BiPAP, Pollen allergy, and Stroke (HCC). ** Patient also on Zepbound**  Social Determinants of Health:       SDOH Screenings Tobacco Use: Medium Risk (01/19/2023)   Additional history:  Additional history obtained from *emr Outside MRI with [facility] showed previous subacute Precentral gyrus    Lab Tests:  I Ordered, and personally interpreted labs.  The pertinent results include:   No acute lab abnormalities   Imaging Studies:  I ordered imaging  studies including CT abd and pelvis I independently visualized and interpreted imaging which showed no acute findings I agree with the radiologist interpretation  Cardiac Monitoring/ECG:       The patient was maintained on a cardiac monitor.  I personally viewed and interpreted the cardiac monitored which showed an underlying rhythm of:  nsr  Medicines ordered and prescription drug management:  I ordered medication including Medications sodium chloride 0.9 % bolus 1,000 mL (has no administration in time range) fentaNYL (SUBLIMAZE) injection 50 mcg (has no administration in time range) ondansetron (ZOFRAN) injection 4 mg (has no administration in time range) for abd pain, vomiting Reevaluation of the patient after these medicines showed that the patient improved I have reviewed the patients home medicines and have made adjustments as needed  Test Considered:       I considered a cardiac work up to r/o ACS, however sxs seep GI  Critical Interventions:         Consultations Obtained:   Problem List / ED Course:       No diagnosis found.  MDM: patient here with n/v/d epigastric and LUQ abdominal pain. Patient does not appear to have pancreatitis or other acute findings on his workup today.  Symptoms may relate  related to his tirzepatide.  He also has a history of reflux.  This was unlikely to cause loose stools however he could also have a gastrointestinal virus/gastroenteritis with recurrent symptoms, gastritis or peptic ulcer disease.  Patient feeling much better.  Will discharge with Compazine and Bentyl.  He is to follow closely with his prescribing physician for symptoms of nausea and vomiting onset found.   Dispostion:  After consideration of the diagnostic results and the patients response to treatment, I feel that the patent would benefit from discharge.    Amount and/or Complexity of Data Reviewed Labs: ordered. Decision-making details documented in ED Course. Radiology: ordered. Decision-making details documented in ED Course.  Risk OTC drugs. Prescription drug management.           Final Clinical Impression(s) / ED Diagnoses Final diagnoses:  Epigastric pain  Nausea and vomiting, unspecified vomiting type    Rx / DC Orders ED Discharge Orders     None         Arthor Captain, PA-C 01/19/23 1148    Gwyneth Sprout, MD 01/20/23 [phone]

## 2023-01-19 NOTE — ED Notes (Signed)

## 2023-01-21 DIAGNOSIS — E291 Testicular hypofunction: Secondary | ICD-10-CM | POA: Diagnosis not present

## 2023-01-21 DIAGNOSIS — Z1331 Encounter for screening for depression: Secondary | ICD-10-CM | POA: Diagnosis not present

## 2023-01-21 DIAGNOSIS — R1013 Epigastric pain: Secondary | ICD-10-CM | POA: Diagnosis not present

## 2023-01-21 DIAGNOSIS — K2900 Acute gastritis without bleeding: Secondary | ICD-10-CM | POA: Diagnosis not present

## 2023-01-21 DIAGNOSIS — R635 Abnormal weight gain: Secondary | ICD-10-CM | POA: Diagnosis not present

## 2023-01-21 DIAGNOSIS — N529 Male erectile dysfunction, unspecified: Secondary | ICD-10-CM | POA: Diagnosis not present

## 2023-01-31 DIAGNOSIS — L0591 Pilonidal cyst without abscess: Secondary | ICD-10-CM | POA: Diagnosis not present

## 2023-02-04 DIAGNOSIS — L0231 Cutaneous abscess of buttock: Secondary | ICD-10-CM | POA: Diagnosis not present

## 2023-02-05 ENCOUNTER — Ambulatory Visit: Payer: BC Managed Care – PPO | Admitting: Cardiology

## 2023-02-07 DIAGNOSIS — L0231 Cutaneous abscess of buttock: Secondary | ICD-10-CM | POA: Diagnosis not present

## 2023-02-07 DIAGNOSIS — L089 Local infection of the skin and subcutaneous tissue, unspecified: Secondary | ICD-10-CM | POA: Diagnosis not present

## 2023-02-11 DIAGNOSIS — Z6839 Body mass index (BMI) 39.0-39.9, adult: Secondary | ICD-10-CM | POA: Diagnosis not present

## 2023-02-11 DIAGNOSIS — I10 Essential (primary) hypertension: Secondary | ICD-10-CM | POA: Diagnosis not present

## 2023-04-11 NOTE — Progress Notes (Signed)
 NEUROLOGY FOLLOW UP OFFICE NOTE  Kyle Manning [id number]  Assessment/Plan:   Left ischemic cortical infarct within the precentral gyrus, likely embolic of unknown source. Hypertension Hyperlipidemia Obstructive sleep apnea Probable bilateral carpal tunnel syndrome, mild - resolved, possibly due to weight loss     Secondary stroke prevention: ASA 81mg  daily monotherapy Atorvastatin 80mg  daily.  Repeat lipid panel (with LFTs)LDL goal less than 70 Normotensive blood pressure Hgb A1c goal less than 7 Mediterranean diet CPAP at night Routine exercise If numbness in hands returns, try wrist splints, otherwise consider NCV-EMG Follow up as needed  Subjective:  Kyle Manning is a 53 year old right-handed male with hypertension, HLD and OSA who follows up for stroke.  CTA personally reviewed.  UPDATE: Current medications:  ASA 81mg , lisinopril, bisoprolol-hydrochlorothiazide, atorvastatin 80mg     Doing well.  Started dieting.  Lost about 60 lbs.  Exercising with the bike but not as much as he would like.  He is walking a lot at work.  Blood pressure has improved.    LDL from yesterday was 43 and Hgb A1c was 4.7.  For several years, he reports transient numbness and tingling in both hands, usually left hand.  May be all fingers but sometimes first 3 digits.  Notices it when sitting and watching TV.  Shakes hands out and it resolves.  He got wrist splints but never used them.  However, symptoms improved.     HISTORY: On 04/25/2022, he woke up that morning and noticed that the index finger and thumb of his right hand felt funny. He had trouble buttoning up his shirt.  When he got to work a little later, he noted difficulty typing and using the mouse at his computer.  On a couple of occasions, he briefly had speech difficulty, each lasting several seconds.  One time, he had trouble getting words out.  Another time, he interchanged words (such as saying "chair red"  instead of "red chair").  Symptoms lasted about 2 hours.  Later, he had trouble again with his hand, difficulty straightening his right index finger.  This lasted about 15 minutes.   He saw his PCP who suspected TIA and started him on an ASA.  MRI of brain with and without contrast on 3/7 showed small focus of faint diffusion restriction within the left precentral gyrus lateral to the hand motor knob concerning for small acute to early subacute ischemic infarct.  2D echo with bubble study on 3/14 showed LVEF 59% with negative bubble study.  Bilateral carotid ultrasound on 3/15 showed no hemodynamically significant stenosis.  He had recently had routine labs checked by his PCP, including a lipid panel that revealed total chol 196, TG 132 and LDL 126.  Sometimes his right hand may become numb with use, such as while typing, but that is nothing new (he had that for years) and he will just shake it out.  No recurrence of speech difficulty.  Denies palpitations.    05/07/2022 LABS:  T Chol 301, TG 134, HDL 52, LDL 124; Hgb A1c 5.1, hepatic panel normal.  Started atorvastatin. 05/15/2022 CTA HEAD & NECK:  1. No acute intracranial process. 2. No intracranial large vessel occlusion. Moderate stenosis in a left A3 branch and a distal right ACA branch. 3. Moderate stenosis in the distal right V4. Underwent stroke workup: 03-05/2022 30 DAY CARDIAC EVENT MONITOR:  NSR.  No paroxymal atrial fibrillation or significant arrhythmia. 10/01/2022 REPEAT LIPID PANEL:  t chol 98, TG 95, HDL  29, LDL 51; CMP WNL     PAST MEDICAL HISTORY: Past Medical History:  Diagnosis Date   GERD (gastroesophageal reflux disease)    HSV (herpes simplex virus) anogenital infection    Hypertension    Kidney stone    OSA treated with BiPAP    severe obstructive sleep apnea with an AHI of 84.7/h on auto BiPAP   Pollen allergy    Stroke [facility])     MEDICATIONS: Current Outpatient Medications on File Prior to Visit  Medication Sig  Dispense Refill   aspirin EC 81 MG tablet Take 81 mg by mouth daily.     atorvastatin (LIPITOR) 80 MG tablet Take 1 tablet (80 mg total) by mouth daily. 30 tablet 5   B Complex Vitamins (VITAMIN B COMPLEX) CAPS as directed Orally     bisoprolol-hydrochlorothiazide (ZIAC) 5-6.25 MG per tablet Take 1 tablet by mouth daily.     Cholecalciferol (VITAMIN D3) 1000 units CAPS 1 capsule Orally Once a day     CLOMID 50 MG tablet Take 50 mg by mouth daily.     dicyclomine (BENTYL) 20 MG tablet Take 1 tablet (20 mg total) by mouth 2 (two) times daily. 20 tablet 0   fluticasone (FLONASE) 50 MCG/ACT nasal spray Place into both nostrils daily.     ibuprofen (ADVIL,MOTRIN) 200 MG tablet Take 400 mg by mouth daily.     lisinopril (ZESTRIL) 10 MG tablet Take 10 mg by mouth daily.     Misc Natural Products (DIM-PLUS) CAPS as directed Orally     Multiple Vitamin (MULTIVITAMIN) capsule Take 1 capsule by mouth daily.     prochlorperazine (COMPAZINE) 10 MG tablet Take 1 tablet (10 mg total) by mouth 2 (two) times daily as needed for nausea or vomiting. 10 tablet 0   ZEPBOUND 2.5 MG/0.5ML Pen Inject 2.5 mg into the skin once a week.     No current facility-administered medications on file prior to visit.     ALLERGIES: No Known Allergies  FAMILY HISTORY: Family History  Problem Relation Age of Onset   Hyperlipidemia Mother    Glaucoma Mother    Stroke Father    Prostate cancer Father    Diabetes Father       Objective:  Blood pressure 115/77, pulse 76, height 5\' 10"  (1.778 m), weight 268 lb 12.8 oz (121.9 kg), SpO2 95%. General: No acute distress.  Patient appears well-groomed.   Head:  Normocephalic/atraumatic Neck:  Supple.  No paraspinal tenderness.  Full range of motion. Heart:  Regular rate and rhythm. Neuro:  Alert and oriented.  Speech fluent and not dysarthric.  Language intact.  CN II-XII intact.  Bulk and tone normal.  Muscle strength 5/5 throughout.  Sensation to pinprick and vibration  intact.  Deep tendon reflexes 1+ throughout.  Gait normal.  Romberg negative.    Shon Millet, DO  CC: Blair Heys, MD

## 2023-04-16 ENCOUNTER — Encounter: Payer: Self-pay | Admitting: Neurology

## 2023-04-16 ENCOUNTER — Ambulatory Visit: Payer: BC Managed Care – PPO | Admitting: Neurology

## 2023-04-16 VITALS — BP 115/77 | HR 76 | Ht 70.0 in | Wt 268.8 lb

## 2023-04-16 DIAGNOSIS — G4733 Obstructive sleep apnea (adult) (pediatric): Secondary | ICD-10-CM | POA: Diagnosis not present

## 2023-04-16 DIAGNOSIS — I10 Essential (primary) hypertension: Secondary | ICD-10-CM | POA: Diagnosis not present

## 2023-04-16 DIAGNOSIS — I63412 Cerebral infarction due to embolism of left middle cerebral artery: Secondary | ICD-10-CM

## 2023-04-16 DIAGNOSIS — E785 Hyperlipidemia, unspecified: Secondary | ICD-10-CM

## 2023-04-16 NOTE — Patient Instructions (Signed)
 Continue aspirin 81mg  daily Continue atorvastatin Continue blood pressure control Mediterranean diet Routine exercise

## 2023-10-14 ENCOUNTER — Telehealth: Payer: Self-pay | Admitting: Cardiology

## 2023-10-14 DIAGNOSIS — G4733 Obstructive sleep apnea (adult) (pediatric): Secondary | ICD-10-CM

## 2023-10-14 DIAGNOSIS — I10 Essential (primary) hypertension: Secondary | ICD-10-CM

## 2023-10-14 NOTE — Telephone Encounter (Signed)
 Patient stated he has not got his new CPAP mask from [employer] [facility] and wants a call back for assistance in coordinating getting this equipment.

## 2023-10-18 NOTE — Telephone Encounter (Signed)
 Reached out to patient to offer him assistance with getting cpap supplies. Patient says he has been trying for a year to get his supplies from [facility] without success. I reached out to [employer] [facility] spoke to Madisson and she states the patients order was never reinstated but she would email the resupply department to get him reinstated and then [employer] [facility] will call the patient to bring his device and supplies into the the retail store to be added into their system.

## 2024-01-14 ENCOUNTER — Telehealth: Payer: Self-pay | Admitting: *Deleted

## 2024-01-14 DIAGNOSIS — G4733 Obstructive sleep apnea (adult) (pediatric): Secondary | ICD-10-CM

## 2024-01-14 DIAGNOSIS — I10 Essential (primary) hypertension: Secondary | ICD-10-CM

## 2024-01-14 NOTE — Telephone Encounter (Signed)
 Fax came from [facility] for patient to fax a new Rx for a new mask stating the Rx on file list a mask that is not in the local store. New mask order sent and reads mask of choice.
